# Patient Record
Sex: Male | Born: 1935 | Race: Black or African American | Hispanic: No | Marital: Single | Smoking: Never smoker
Health system: Southern US, Community
[De-identification: ages and names within clinical notes are randomized; demographics above are authoritative.]

## PROBLEM LIST (undated history)

## (undated) DIAGNOSIS — E1165 Type 2 diabetes mellitus with hyperglycemia: Secondary | ICD-10-CM

## (undated) DIAGNOSIS — I10 Essential (primary) hypertension: Secondary | ICD-10-CM

## (undated) DIAGNOSIS — E7800 Pure hypercholesterolemia, unspecified: Secondary | ICD-10-CM

## (undated) DIAGNOSIS — M109 Gout, unspecified: Secondary | ICD-10-CM

## (undated) DIAGNOSIS — E1100 Type 2 diabetes mellitus with hyperosmolarity without nonketotic hyperglycemic-hyperosmolar coma (NKHHC): Secondary | ICD-10-CM

---

## 1998-05-03 ENCOUNTER — Emergency Department (HOSPITAL_COMMUNITY): Admission: EM | Admit: 1998-05-03 | Discharge: 1998-05-03 | Payer: Self-pay | Admitting: Emergency Medicine

## 1999-11-27 ENCOUNTER — Ambulatory Visit (HOSPITAL_COMMUNITY): Admission: RE | Admit: 1999-11-27 | Discharge: 1999-11-27 | Payer: Self-pay | Admitting: *Deleted

## 1999-11-27 ENCOUNTER — Encounter (INDEPENDENT_AMBULATORY_CARE_PROVIDER_SITE_OTHER): Payer: Self-pay | Admitting: *Deleted

## 2002-11-11 ENCOUNTER — Ambulatory Visit (HOSPITAL_COMMUNITY): Admission: RE | Admit: 2002-11-11 | Discharge: 2002-11-11 | Payer: Self-pay | Admitting: *Deleted

## 2002-11-11 ENCOUNTER — Encounter (INDEPENDENT_AMBULATORY_CARE_PROVIDER_SITE_OTHER): Payer: Self-pay | Admitting: *Deleted

## 2003-01-05 ENCOUNTER — Encounter: Admission: RE | Admit: 2003-01-05 | Discharge: 2003-01-05 | Payer: Self-pay | Admitting: Cardiology

## 2003-01-05 ENCOUNTER — Encounter: Payer: Self-pay | Admitting: Cardiology

## 2005-01-09 ENCOUNTER — Encounter: Admission: RE | Admit: 2005-01-09 | Discharge: 2005-01-09 | Payer: Self-pay | Admitting: Urology

## 2005-01-15 ENCOUNTER — Ambulatory Visit (HOSPITAL_BASED_OUTPATIENT_CLINIC_OR_DEPARTMENT_OTHER): Admission: RE | Admit: 2005-01-15 | Discharge: 2005-01-15 | Payer: Self-pay | Admitting: Urology

## 2005-01-15 ENCOUNTER — Ambulatory Visit (HOSPITAL_COMMUNITY): Admission: RE | Admit: 2005-01-15 | Discharge: 2005-01-15 | Payer: Self-pay | Admitting: Urology

## 2006-02-04 ENCOUNTER — Ambulatory Visit (HOSPITAL_BASED_OUTPATIENT_CLINIC_OR_DEPARTMENT_OTHER): Admission: RE | Admit: 2006-02-04 | Discharge: 2006-02-04 | Payer: Self-pay | Admitting: Ophthalmology

## 2006-02-04 ENCOUNTER — Encounter (INDEPENDENT_AMBULATORY_CARE_PROVIDER_SITE_OTHER): Payer: Self-pay | Admitting: Ophthalmology

## 2009-08-10 ENCOUNTER — Encounter: Admission: RE | Admit: 2009-08-10 | Discharge: 2009-08-10 | Payer: Self-pay | Admitting: Cardiology

## 2010-12-15 NOTE — Op Note (Signed)
NAME:  Joseph Robertson, Joseph Robertson                ACCOUNT NO.:  [id number]   MEDICAL RECORD NO.:  [id number]          PATIENT TYPE:  AMB   LOCATION:  DSC                          FACILITY:  [facility]   PHYSICIAN:  Salley Scarlet., M.D.DATE OF BIRTH:  1933/07/24   DATE OF PROCEDURE:  02/05/2006  DATE OF DISCHARGE:                                 OPERATIVE REPORT   PREOPERATIVE DIAGNOSIS:  Cyst upper lid, left eye.   POSTOPERATIVE DIAGNOSIS:  Cyst upper lid, left eye.   OPERATION:  Cyst excision upper lid, left eye.   ANESTHESIA:  Local using Xylocaine 1%.   PROCEDURE:  The upper lid of the left eye was inspected and there was  located along the medial aspect of upper lid between the brow and lid  margin, a cyst having the dimensions of approximately 1.5 cm x 1 The lid was  then prepped and draped in the usual manner.  The lid was infiltrated with  several mL of Xylocaine.  A curvilinear incision was made around the lesion  and the lesion was carefully undermined using sharp and blunt dissection.  The lesion was then excised in toto. The skin was then reapproximated by  using multiple sutures of interrupted 6-0 silk. A Polysporin ophthalmic  ointment and a pressure patch was applied.  The patient tolerated the  procedure well and was discharged in satisfactory condition with  instructions to take Tylenol every 4 hours as needed for pain, to remove the  patch tomorrow, to apply Polysporin opthalmic ointment to the lesion twice a  day and to see me in the office in one week.   DISCHARGE DIAGNOSIS:  Cyst upper lid, left eye.      Salley Scarlet., M.D.  Electronically Signed     TB/MEDQ  D:  02/05/2006  T:  02/05/2006  Job:  [id number]

## 2010-12-15 NOTE — Procedures (Signed)
[place]  Patient:    Joseph Robertson, Joseph Robertson                       MRN: [id number] Proc. Date: 11/27/99 Adm. Date:  [id number] Attending:  Mingo Amber CC:         Osvaldo Shipper. Spruill, M.D.                           Procedure Report  PROCEDURE:  Video colonoscopy.  INDICATIONS FOR PROCEDURE:  A 77 year old male requesting screening colonoscopy.  PREPARATION:  He is n.p.o. since midnight, having taken Phospho-Soda prep and a  clear liquid diet yesterday.  The mucosa is clean throughout.  DEPTH OF INSERTION:  Cecum.  PREPROCEDURE SEDATION:  He received 100 mcg of fentanyl and 10 mg of Versed intravenously.  In addition, he was on 2 L of nasal cannula O2.  DESCRIPTION OF PROCEDURE:  The Olympus video colonoscope was inserted via the rectum and advanced quite easily all the way through the colon to the cecum. Cecal landmarks were identified and photographed.  On withdrawal, the mucosa was carefully evaluated.  Note was made of pandiverticulosis, worse on the left than the right, but this was only moderate in number.  At 35 cm from the rectum, was a 1 cm pedunculated polyp that was removed with electrosnare cautery.  A second diminutive sessile polyp was just adjacent, and also removed.  The specimens were recovered for histologic evaluation.  A retroflexed view of the rectum demonstrated grade 1-2 internal hemorrhoids.  No other lesions were noted.  The patient tolerated the procedure well.  Pulse, blood pressure and oximetry testing were stable throughout.  He was observed in recovery for 45 minutes and discharged to home alert with a benign abdomen.  IMPRESSION: 1. Two small sigmoid polyps. 2. Pandiverticulosis. 3. Grade 1-2 internal hemorrhoids.  RECOMMENDATIONS:  The patient was given a brochure on diverticulosis.  High fiber diet is advised.  He will be alerted in a letter of the polyp histology and probable advised to have repeat  colonoscopy in three years, should the larger polyp prove to be adenomatous, which I suspect.  Return to the office p.r.n. DD:  11/27/99 TD:  11/28/99 Job: [id number]

## 2010-12-15 NOTE — Op Note (Signed)
NAME:  Joseph Robertson, Joseph Robertson                ACCOUNT NO.:  [id number]   MEDICAL RECORD NO.:  [id number]          PATIENT TYPE:  AMB   LOCATION:  [facility]                         FACILITY:  [facility]   PHYSICIAN:  Lindaann Slough, M.D.  DATE OF BIRTH:  January 01, 1933   DATE OF PROCEDURE:  01/15/2005  DATE OF DISCHARGE:                                 OPERATIVE REPORT   PREOPERATIVE DIAGNOSIS:  Left hydrocele.   POSTOPERATIVE DIAGNOSIS:  Left hydrocele.   PROCEDURE DONE:  Left hydrocelectomy.   SURGEON:  Lindaann Slough, M.D.   ANESTHESIA:  General.   INDICATIONS:  The patient is a 77 year old male who had been complaining of  swelling of the scrotum for several months. He was found on physical  examination to have bilateral hydrocele, left greater than right. The  hydrocele on the right side is small. He is scheduled today for  hydrocelectomy.   DESCRIPTION OF PROCEDURE:  Under general anesthesia, the patient was prepped  and draped and placed in the supine position.  A longitudinal incision was  made on the left scrotum. The incision was carried down to the tunica  vaginalis which was then incised.  About 250 cc of fluid were drained out of  the hydrocele sac. The appendix testis was then excised, and then the tunica  vaginalis was imbricated using the Lord's technique with #3-0 chromic.  Hemostasis was secured with electrocautery. The wound was then irrigated  with normal saline.  Then, the scrotum was closed in two  layers with #3-0  chromic.  Then, the incision was infiltrated with 0.25% Marcaine.  The  patient tolerated the procedure well and left the OR in satisfactory  condition to the postanesthesia care unit.       MN/MEDQ  D:  01/15/2005  T:  01/15/2005  Job:  [id number]   cc:   Osvaldo Shipper. Spruill, M.D.  [address] [address]  [place]  [place] [address]  Fax: [phone]

## 2016-11-10 ENCOUNTER — Emergency Department (HOSPITAL_COMMUNITY)
Admission: EM | Admit: 2016-11-10 | Discharge: 2016-11-10 | Disposition: A | Discharge status: Home / Self Care | Payer: Medicare Other | Attending: Emergency Medicine | Admitting: Emergency Medicine

## 2016-11-10 ENCOUNTER — Encounter (HOSPITAL_COMMUNITY): Payer: Self-pay | Admitting: Emergency Medicine

## 2016-11-10 DIAGNOSIS — I10 Essential (primary) hypertension: Secondary | ICD-10-CM | POA: Insufficient documentation

## 2016-11-10 DIAGNOSIS — Z79899 Other long term (current) drug therapy: Secondary | ICD-10-CM | POA: Diagnosis not present

## 2016-11-10 DIAGNOSIS — B029 Zoster without complications: Secondary | ICD-10-CM | POA: Insufficient documentation

## 2016-11-10 DIAGNOSIS — R21 Rash and other nonspecific skin eruption: Secondary | ICD-10-CM | POA: Diagnosis present

## 2016-11-10 HISTORY — DX: Gout, unspecified: M10.9

## 2016-11-10 HISTORY — DX: Pure hypercholesterolemia, unspecified: E78.00

## 2016-11-10 HISTORY — DX: Essential (primary) hypertension: I10

## 2016-11-10 MED ORDER — ACYCLOVIR 400 MG PO TABS: 800.0000 mg | ORAL_TABLET | Freq: Every day | ORAL | 0 refills | Status: DC

## 2016-11-10 MED ORDER — TETRACAINE HCL 0.5 % OP SOLN
1.0000 [drp] | Freq: Once | OPHTHALMIC | Status: AC
  Administered 2016-11-10: 1 [drp] via OPHTHALMIC
  Filled 2016-11-10: qty 2

## 2016-11-10 MED ORDER — FLUORESCEIN SODIUM 0.6 MG OP STRP
1.0000 | ORAL_STRIP | Freq: Once | OPHTHALMIC | Status: AC
  Administered 2016-11-10: 1 via OPHTHALMIC
  Filled 2016-11-10: qty 1

## 2016-11-10 NOTE — Discharge Instructions (Signed)
Begin taking acyclovir 5 times daily for 7 days. Follow up with eye doctor as soon as possible. Follow up with PCP in 1-2 days. Return to ED for worsening symptoms, no improvement, vision changes, discharge from eye, trouble breathing, chest pain.

## 2016-11-10 NOTE — ED Provider Notes (Signed)
[facility] Provider Note   CSN: [id number] Arrival date & time: 11/10/16  1128     History   Chief Complaint Chief Complaint  Patient presents with  . Eye Problem  . Rash    HPI Joseph Robertson is a 83 y.o. male.  Patient presents with one-week history of L eye redness and swelling with associated rash on corner of eye, L side of forehead, L side of scalp that began and has developed and increased since eye swelling began. No changes in vision or discharge from eye. He saw his optometrist who was unsure of the cause of the eye swelling. He reports pain on the L side of his scalp near the area of the lesion. No history of trauma, use of new soaps or lotions or bites. States he received shingles vaccine 2-3 years ago. No prior history of these symptoms in the past. Denies lesions elsewhere, nausea, vomiting, trouble hearing, headache, trouble breathing.      Past Medical History:  Diagnosis Date  . Gout   . High cholesterol   . Hypertension     There are no active problems to display for this patient.   History reviewed. No pertinent surgical history.     Home Medications    Prior to Admission medications   Medication Sig Start Date End Date Taking? Authorizing Provider  allopurinol (ZYLOPRIM) 100 MG tablet Take 100 mg by mouth daily. 08/17/16  Yes Historical Provider, MD  amLODipine-benazepril (LOTREL) 10-20 MG capsule Take 1 capsule by mouth daily. 10/11/16  Yes Historical Provider, MD  metoprolol succinate (TOPROL-XL) 25 MG 24 hr tablet Take 25 mg by mouth every evening. 10/11/16  Yes Historical Provider, MD  simvastatin (ZOCOR) 20 MG tablet Take 20 mg by mouth daily. 10/11/16  Yes Historical Provider, MD  TOBRADEX ophthalmic ointment Place 1 application into the left eye 2 (two) times daily. 11/07/16  Yes Historical Provider, MD  white petrolatum (VASELINE) GEL Apply 1 application topically daily.   Yes Historical Provider, MD  acyclovir (ZOVIRAX) 400 MG  tablet Take 2 tablets (800 mg total) by mouth 5 (five) times daily. 11/10/16   Verble Styron, PA-C    Family History No family history on file.  Social History Social History  Substance Use Topics  . Smoking status: Never Smoker  . Smokeless tobacco: Never Used  . Alcohol use No     Allergies   Patient has no known allergies.   Review of Systems Review of Systems  Constitutional: Negative for appetite change, chills and fever.  HENT: Negative for ear pain, sneezing and sore throat.   Eyes: Positive for redness. Negative for photophobia, pain, discharge, itching and visual disturbance.  Respiratory: Negative for cough, chest tightness, shortness of breath and wheezing.   Cardiovascular: Negative for chest pain and palpitations.  Gastrointestinal: Negative for abdominal pain, blood in stool, constipation, diarrhea, nausea and vomiting.  Genitourinary: Negative for dysuria.  Musculoskeletal: Negative for myalgias.  Skin: Positive for rash.  Neurological: Negative for dizziness, weakness, light-headedness and headaches.     Physical Exam Updated Vital Signs BP (!) 166/92   Pulse 80   Temp 98.5 F (36.9 C) (Oral)   Resp 16   Ht 6' (1.829 m)   Wt 98.4 kg   SpO2 98%   BMI 29.43 kg/m   Physical Exam  Constitutional: He appears well-developed and well-nourished. No distress.  HENT:  Head: Normocephalic and atraumatic.  Nose: Nose normal.  Eyes: EOM are normal. Pupils are equal,  round, and reactive to light. Right eye exhibits no discharge. Left eye exhibits no discharge. Left conjunctiva is injected. No scleral icterus. Right eye exhibits normal extraocular motion. Left eye exhibits normal extraocular motion.  Fluorescein exam revealed no dendritic lesions.  Neck: Normal range of motion. Neck supple.  Cardiovascular: Normal rate, regular rhythm, normal heart sounds and intact distal pulses.  Exam reveals no gallop and no friction rub.   No murmur heard. Pulmonary/Chest:  Effort normal and breath sounds normal. No respiratory distress.  Abdominal: Soft. Bowel sounds are normal. He exhibits no distension. There is no tenderness. There is no guarding.  Musculoskeletal: Normal range of motion. He exhibits no edema.  Neurological: He is alert. He exhibits normal muscle tone. Coordination normal.  Skin: Skin is warm and dry. Rash noted.  See image attached. Vesicular, scabbed over lesions present at corner of L eye, forehead and scalp. No lesions present around ear or on R side of head. No lesions on tip of nose.  Psychiatric: He has a normal mood and affect.  Nursing note and vitals reviewed.      ED Treatments / Results  Labs (all labs ordered are listed, but only abnormal results are displayed) Labs Reviewed - No data to display  EKG  EKG Interpretation None       Radiology No results found.  Procedures Procedures (including critical care time)  Medications Ordered in ED Medications  fluorescein ophthalmic strip 1 strip (1 strip Left Eye Given by Other 11/10/16 1307)  tetracaine (PONTOCAINE) 0.5 % ophthalmic solution 1 drop (1 drop Left Eye Given by Other 11/10/16 1307)     Initial Impression / Assessment and Plan / ED Course  I have reviewed the triage vital signs and the nursing notes.  Pertinent labs & imaging results that were available during my care of the patient were reviewed by me and considered in my medical decision making (see chart for details).     Patient's history and symptoms concerning for shingles vs. MRSA/cellulitis. Patient denies history of prior skin infections, MRSA infections or IVDU. Lesions are consistent with shingles presentation, in that they do not cross midline and are vesicular lesions with associated pain. No ocular involvement noted, due to normal visual acuity and normal fluorescein exam. Will give acyclovir. Patient is out of window for steroid treatment as this will cause more harm than good. Return  precautions given.   Final Clinical Impressions(s) / ED Diagnoses   Final diagnoses:  Herpes zoster without complication    New Prescriptions Discharge Medication List as of 11/10/2016  1:49 PM    START taking these medications   Details  acyclovir (ZOVIRAX) 400 MG tablet Take 2 tablets (800 mg total) by mouth 5 (five) times daily., Starting Sat 11/10/2016, Print         Manika Hast [place], [place] 11/10/16 2337    Nira Conn, MD 11/11/16 [phone]

## 2016-11-10 NOTE — ED Triage Notes (Signed)
Pt. Stated, Im having red and swelling in my left eye and this rash and scabs around my face , started last Tuesday. I thought it was shingles. I went to my eye doctor he didn't know.

## 2017-05-19 ENCOUNTER — Observation Stay (HOSPITAL_COMMUNITY)
Admission: EM | Admit: 2017-05-19 | Discharge: 2017-05-21 | Disposition: A | Discharge status: Home / Self Care | Payer: Medicare Other | Attending: Internal Medicine | Admitting: Internal Medicine

## 2017-05-19 ENCOUNTER — Emergency Department (HOSPITAL_COMMUNITY): Payer: Medicare Other

## 2017-05-19 DIAGNOSIS — R000 Tachycardia, unspecified: Secondary | ICD-10-CM

## 2017-05-19 DIAGNOSIS — Z7982 Long term (current) use of aspirin: Secondary | ICD-10-CM | POA: Insufficient documentation

## 2017-05-19 DIAGNOSIS — I10 Essential (primary) hypertension: Secondary | ICD-10-CM | POA: Diagnosis not present

## 2017-05-19 DIAGNOSIS — M109 Gout, unspecified: Secondary | ICD-10-CM | POA: Diagnosis not present

## 2017-05-19 DIAGNOSIS — R531 Weakness: Secondary | ICD-10-CM | POA: Insufficient documentation

## 2017-05-19 DIAGNOSIS — Z79899 Other long term (current) drug therapy: Secondary | ICD-10-CM | POA: Diagnosis not present

## 2017-05-19 DIAGNOSIS — E1100 Type 2 diabetes mellitus with hyperosmolarity without nonketotic hyperglycemic-hyperosmolar coma (NKHHC): Secondary | ICD-10-CM

## 2017-05-19 DIAGNOSIS — E785 Hyperlipidemia, unspecified: Secondary | ICD-10-CM | POA: Diagnosis present

## 2017-05-19 DIAGNOSIS — R739 Hyperglycemia, unspecified: Secondary | ICD-10-CM | POA: Diagnosis present

## 2017-05-19 DIAGNOSIS — N179 Acute kidney failure, unspecified: Secondary | ICD-10-CM | POA: Insufficient documentation

## 2017-05-19 DIAGNOSIS — E7800 Pure hypercholesterolemia, unspecified: Secondary | ICD-10-CM | POA: Insufficient documentation

## 2017-05-19 DIAGNOSIS — E1165 Type 2 diabetes mellitus with hyperglycemia: Secondary | ICD-10-CM | POA: Diagnosis not present

## 2017-05-19 DIAGNOSIS — E861 Hypovolemia: Secondary | ICD-10-CM

## 2017-05-19 HISTORY — DX: Type 2 diabetes mellitus with hyperosmolarity without nonketotic hyperglycemic-hyperosmolar coma (NKHHC): E11.00

## 2017-05-19 HISTORY — DX: Type 2 diabetes mellitus with hyperglycemia: E11.65

## 2017-05-19 LAB — BASIC METABOLIC PANEL
Anion gap: 6 (ref 5–15)
BUN: 29 mg/dL — AB (ref 6–20)
CO2: 25 mmol/L (ref 22–32)
CREATININE: 1.9 mg/dL — AB (ref 0.61–1.24)
Calcium: 9.3 mg/dL (ref 8.9–10.3)
Chloride: 109 mmol/L (ref 101–111)
GFR, EST AFRICAN AMERICAN: 36 mL/min — AB (ref >60)
GFR, EST NON AFRICAN AMERICAN: 31 mL/min — AB (ref >60)
Glucose, Bld: 332 mg/dL — ABNORMAL HIGH (ref 65–99)
Potassium: 5 mmol/L (ref 3.5–5.1)
SODIUM: 140 mmol/L (ref 135–145)

## 2017-05-19 LAB — I-STAT VENOUS BLOOD GAS, ED
ACID-BASE EXCESS: 6 mmol/L — AB (ref 0.0–2.0)
Bicarbonate: 30.9 mmol/L — ABNORMAL HIGH (ref 20.0–28.0)
O2 Saturation: 84 %
PH VEN: 7.448 — AB (ref 7.250–7.430)
PO2 VEN: 45 mmHg (ref 32.0–45.0)
TCO2: 32 mmol/L (ref 22–32)
pCO2, Ven: 44.6 mmHg (ref 44.0–60.0)

## 2017-05-19 LAB — I-STAT CHEM 8, ED
BUN: 35 mg/dL — AB (ref 6–20)
CALCIUM ION: 1.25 mmol/L (ref 1.15–1.40)
CHLORIDE: 100 mmol/L — AB (ref 101–111)
Creatinine, Ser: 2 mg/dL — ABNORMAL HIGH (ref 0.61–1.24)
Glucose, Bld: >700 mg/dL (ref 65–99)
HEMATOCRIT: 52 % (ref 39.0–52.0)
Hemoglobin: 17.7 g/dL — ABNORMAL HIGH (ref 13.0–17.0)
POTASSIUM: 4.8 mmol/L (ref 3.5–5.1)
SODIUM: 140 mmol/L (ref 135–145)
TCO2: 26 mmol/L (ref 22–32)

## 2017-05-19 LAB — URINALYSIS, ROUTINE W REFLEX MICROSCOPIC
BACTERIA UA: NONE SEEN
BILIRUBIN URINE: NEGATIVE
Glucose, UA: >=500 mg/dL — AB
HGB URINE DIPSTICK: NEGATIVE
Ketones, ur: 5 mg/dL — AB
LEUKOCYTES UA: NEGATIVE
NITRITE: NEGATIVE
PROTEIN: NEGATIVE mg/dL
RBC / HPF: NONE SEEN RBC/hpf (ref 0–5)
SQUAMOUS EPITHELIAL / LPF: NONE SEEN
Specific Gravity, Urine: 1.03 (ref 1.005–1.030)
pH: 5 (ref 5.0–8.0)

## 2017-05-19 LAB — COMPREHENSIVE METABOLIC PANEL
ALBUMIN: 4.4 g/dL (ref 3.5–5.0)
ALT: 14 U/L — AB (ref 17–63)
AST: 16 U/L (ref 15–41)
Alkaline Phosphatase: 114 U/L (ref 38–126)
Anion gap: 10 (ref 5–15)
BILIRUBIN TOTAL: 1.4 mg/dL — AB (ref 0.3–1.2)
BUN: 33 mg/dL — AB (ref 6–20)
CHLORIDE: 99 mmol/L — AB (ref 101–111)
CO2: 26 mmol/L (ref 22–32)
CREATININE: 2.33 mg/dL — AB (ref 0.61–1.24)
Calcium: 10 mg/dL (ref 8.9–10.3)
GFR calc Af Amer: 28 mL/min — ABNORMAL LOW (ref >60)
GFR, EST NON AFRICAN AMERICAN: 24 mL/min — AB (ref >60)
GLUCOSE: 845 mg/dL — AB (ref 65–99)
Potassium: 4.6 mmol/L (ref 3.5–5.1)
Sodium: 135 mmol/L (ref 135–145)
TOTAL PROTEIN: 8.1 g/dL (ref 6.5–8.1)

## 2017-05-19 LAB — PROTIME-INR
INR: 1
Prothrombin Time: 13.1 seconds (ref 11.4–15.2)

## 2017-05-19 LAB — DIFFERENTIAL
BASOS ABS: 0 10*3/uL (ref 0.0–0.1)
Basophils Relative: 0 %
Eosinophils Absolute: 0 10*3/uL (ref 0.0–0.7)
Eosinophils Relative: 0 %
LYMPHS ABS: 0.8 10*3/uL (ref 0.7–4.0)
Lymphocytes Relative: 14 %
MONOS PCT: 5 %
Monocytes Absolute: 0.3 10*3/uL (ref 0.1–1.0)
NEUTROS ABS: 4.4 10*3/uL (ref 1.7–7.7)
NEUTROS PCT: 81 %

## 2017-05-19 LAB — CBC
HEMATOCRIT: 51.1 % (ref 39.0–52.0)
HEMOGLOBIN: 17.1 g/dL — AB (ref 13.0–17.0)
MCH: 29.5 pg (ref 26.0–34.0)
MCHC: 33.5 g/dL (ref 30.0–36.0)
MCV: 88.1 fL (ref 78.0–100.0)
Platelets: 243 10*3/uL (ref 150–400)
RBC: 5.8 MIL/uL (ref 4.22–5.81)
RDW: 13.3 % (ref 11.5–15.5)
WBC: 5.4 10*3/uL (ref 4.0–10.5)

## 2017-05-19 LAB — CBG MONITORING, ED
GLUCOSE-CAPILLARY: 496 mg/dL — AB (ref 65–99)
Glucose-Capillary: >600 mg/dL (ref 65–99)
Glucose-Capillary: 423 mg/dL — ABNORMAL HIGH (ref 65–99)
Glucose-Capillary: 319 mg/dL — ABNORMAL HIGH (ref 65–99)
Glucose-Capillary: 128 mg/dL — ABNORMAL HIGH (ref 65–99)
Glucose-Capillary: 143 mg/dL — ABNORMAL HIGH (ref 65–99)

## 2017-05-19 LAB — HEMOGLOBIN A1C
Hgb A1c MFr Bld: 14.8 % — ABNORMAL HIGH (ref 4.8–5.6)
Mean Plasma Glucose: 378.06 mg/dL

## 2017-05-19 LAB — I-STAT TROPONIN, ED: Troponin i, poc: 0 ng/mL (ref 0.00–0.08)

## 2017-05-19 LAB — APTT: aPTT: 27 seconds (ref 24–36)

## 2017-05-19 LAB — MAGNESIUM: Magnesium: 2.5 mg/dL — ABNORMAL HIGH (ref 1.7–2.4)

## 2017-05-19 LAB — TSH: TSH: 1.304 u[IU]/mL (ref 0.350–4.500)

## 2017-05-19 MED ORDER — POTASSIUM CHLORIDE 10 MEQ/100ML IV SOLN
10.0000 meq | INTRAVENOUS | Status: AC
  Administered 2017-05-19 (×2): 10 meq via INTRAVENOUS
  Filled 2017-05-19 (×2): qty 100

## 2017-05-19 MED ORDER — ASPIRIN EC 81 MG PO TBEC
81.0000 mg | DELAYED_RELEASE_TABLET | Freq: Every day | ORAL | Status: DC
  Administered 2017-05-20 – 2017-05-21 (×2): 81 mg via ORAL
  Filled 2017-05-19 (×2): qty 1

## 2017-05-19 MED ORDER — INSULIN GLARGINE 100 UNIT/ML ~~LOC~~ SOLN
20.0000 [IU] | Freq: Once | SUBCUTANEOUS | Status: AC
  Administered 2017-05-20: 20 [IU] via SUBCUTANEOUS
  Filled 2017-05-19 (×2): qty 0.2

## 2017-05-19 MED ORDER — SODIUM CHLORIDE 0.9 % IV BOLUS (SEPSIS)
1000.0000 mL | Freq: Once | INTRAVENOUS | Status: AC
  Administered 2017-05-19: 1000 mL via INTRAVENOUS

## 2017-05-19 MED ORDER — SODIUM CHLORIDE 0.9 % IV BOLUS (SEPSIS): 1000.0000 mL | Freq: Once | INTRAVENOUS | Status: DC

## 2017-05-19 MED ORDER — ACETAMINOPHEN 325 MG PO TABS: 650.0000 mg | ORAL_TABLET | Freq: Four times a day (QID) | ORAL | Status: DC | PRN

## 2017-05-19 MED ORDER — INSULIN GLARGINE 100 UNIT/ML ~~LOC~~ SOLN: 16.0000 [IU] | Freq: Once | SUBCUTANEOUS | Status: DC

## 2017-05-19 MED ORDER — SODIUM CHLORIDE 0.9 % IV SOLN
INTRAVENOUS | Status: DC
  Administered 2017-05-19: 20:00:00 via INTRAVENOUS

## 2017-05-19 MED ORDER — AMLODIPINE BESYLATE 5 MG PO TABS
10.0000 mg | ORAL_TABLET | Freq: Every day | ORAL | Status: DC
  Administered 2017-05-20 – 2017-05-21 (×2): 10 mg via ORAL
  Filled 2017-05-19: qty 2
  Filled 2017-05-19: qty 1

## 2017-05-19 MED ORDER — SODIUM CHLORIDE 0.9 % IV BOLUS (SEPSIS)
2000.0000 mL | Freq: Once | INTRAVENOUS | Status: AC
  Administered 2017-05-19: 2000 mL via INTRAVENOUS

## 2017-05-19 MED ORDER — DEXTROSE-NACL 5-0.45 % IV SOLN
INTRAVENOUS | Status: DC
  Administered 2017-05-19: 22:00:00 via INTRAVENOUS

## 2017-05-19 MED ORDER — DEXTROSE 50 % IV SOLN: 1.0000 | INTRAVENOUS | Status: DC | PRN

## 2017-05-19 MED ORDER — ACETAMINOPHEN 650 MG RE SUPP: 650.0000 mg | Freq: Four times a day (QID) | RECTAL | Status: DC | PRN

## 2017-05-19 MED ORDER — ALLOPURINOL 100 MG PO TABS
100.0000 mg | ORAL_TABLET | Freq: Every day | ORAL | Status: DC
  Administered 2017-05-20 – 2017-05-21 (×2): 100 mg via ORAL
  Filled 2017-05-19 (×2): qty 1

## 2017-05-19 MED ORDER — SODIUM CHLORIDE 0.9 % IV BOLUS (SEPSIS): 2000.0000 mL | Freq: Once | INTRAVENOUS | Status: DC

## 2017-05-19 MED ORDER — INSULIN REGULAR HUMAN 100 UNIT/ML IJ SOLN
INTRAMUSCULAR | Status: DC
  Administered 2017-05-19: 5.4 [IU]/h via INTRAVENOUS
  Filled 2017-05-19: qty 1

## 2017-05-19 MED ORDER — SIMVASTATIN 20 MG PO TABS
20.0000 mg | ORAL_TABLET | Freq: Every day | ORAL | Status: DC
  Administered 2017-05-20 – 2017-05-21 (×2): 20 mg via ORAL
  Filled 2017-05-19 (×2): qty 1

## 2017-05-19 MED ORDER — METOPROLOL SUCCINATE ER 25 MG PO TB24
25.0000 mg | ORAL_TABLET | Freq: Every evening | ORAL | Status: DC
  Administered 2017-05-19 – 2017-05-20 (×2): 25 mg via ORAL
  Filled 2017-05-19 (×2): qty 1

## 2017-05-19 MED ORDER — HEPARIN SODIUM (PORCINE) 5000 UNIT/ML IJ SOLN
5000.0000 [IU] | Freq: Three times a day (TID) | INTRAMUSCULAR | Status: DC
  Administered 2017-05-20 – 2017-05-21 (×4): 5000 [IU] via SUBCUTANEOUS
  Filled 2017-05-19 (×4): qty 1

## 2017-05-19 NOTE — ED Notes (Signed)
Admitting md's at bedside. Pt stable. VSS. Labs to be drawn.

## 2017-05-19 NOTE — ED Triage Notes (Addendum)
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 States 3 days ago she noted he was having trouble walking, shuffling gait. Pt pt has left sided facial droop. Alert and oriented to person place and time. No sensory deficits, no drift to arms or legs. No dysarthria/slurred speech but wife states his voice has sounded different for 3 days. Pt wife states she finally made him come today when he almost fell down the stairs. Pt also states a few weeks ago he has noticed frequent urination.

## 2017-05-19 NOTE — ED Notes (Signed)
Dr.tegeler informed of pts Istat Chem8 results. ED-Lab.

## 2017-05-19 NOTE — H&P (Signed)
Date: 05/19/2017               Patient Name:  Joseph Robertson MRN: [id number]  DOB: 1932/10/12 Age / Sex: 83 y.o., male   PCP: Patient, No Pcp Per         Medical Service: Internal Medicine Teaching Service         Attending Physician: Dr. Gust Rung, DO    First Contact: Dr. Thornell Mule Pager: [phone]  Second Contact: Dr. Nyra Market Pager: [phone]       After Hours (After 5p/  First Contact Pager: [phone]  weekends / holidays): Second Contact Pager: [phone]   Chief Complaint: several weeks polyuria, polydipsia, and weakness  History of Present Illness: Joseph Robertson is an 83yo male with PMH significant for HTN, hypercholesterolemia, and gout who presents with several weeks of polyuria and polydipsia. Wife and daughter are at bedside, who helped provide the history. This morning, at church, he felt weak, had trouble walking, and just felt off. For the last couple days, his wife also noted that he has been "not been right" with a change in the tone of his voice, gait change, and not thinking normally. She says the tone of his voice is still not quite right now. Denies focal numbness or weakness of extremities. Patient endorses polyuria and polydipsia for several weeks. Wife also reports that she has noticed he has lost some weight recently, with his pants being looser than they were before. Denies HA, N/V, or blurry vision. Denies fever/chills, chest pain, shortness of breath, cough, congestion, leg swelling, dysuria. Denies smoking or alcohol use.  Denies history of stroke or of diabetes. On aspirin 81mg , not on other blood thinners. No recent medication changes. Wife reports that he does drink a lot of juice and some bread, but does not eat many sweets. His PCP is Dr. Shirline Frees. He had a physical with his PCP and was told that he has "borderline diabetes", but is not sure whether he had an A1c done.  ED course - BP 113/74, HR 104, temp 97.8, RR 18, O2 95% on RA.  - Glucose  845. Na 135, K 4.6, bicarb 26, Cr 2.33 (no baseline available), no anion gap. Venous pH 7.45. WBC 5.4, Hgb 17.1. UA not infected, 5 ketones. Troponin negative and normal INR. Normal TSH. - EKG sinus tachycardia. CXR without active disease. CT head with chronic microvascular ischemia but no acute abnormality. - S/p 3L NS + 121mL/hr. IV K . Insulin drip started.   Meds:  Current Meds  Medication Sig  . allopurinol (ZYLOPRIM) 100 MG tablet Take 100 mg by mouth daily.  Marland Kitchen amLODipine-benazepril (LOTREL) 10-20 MG capsule Take 1 capsule by mouth daily.  Marland Kitchen aspirin EC 81 MG tablet Take 81 mg by mouth daily.  . metoprolol succinate (TOPROL-XL) 25 MG 24 hr tablet Take 25 mg by mouth every evening.  . simvastatin (ZOCOR) 20 MG tablet Take 20 mg by mouth daily.   Allergies: Allergies as of 05/19/2017  . (No Known Allergies)   Past Medical History:  Diagnosis Date  . Gout   . High cholesterol   . Hypertension    Family History:  No family history on file.  Social History:  Social History   Social History  . Marital status: Single    Spouse name: N/A  . Number of children: N/A  . Years of education: N/A   Social History Main Topics  . Smoking status: Never Smoker  .  Smokeless tobacco: Never Used  . Alcohol use No  . Drug use: No  . Sexual activity: Not on file   Other Topics Concern  . Not on file   Social History Narrative  . No narrative on file   Review of Systems: Constitutional: Negative for diaphoresis, fever. Positive for fatigue and weight loss. HEENT: Negative for blurred vision, hearing loss, sinus pain, congestion, and sore throat. Respiratory: Negative for cough, shortness of breath and wheezing. Cardiovascular: Negative for chest pain, orthopnea, PND, and leg swelling. Gastrointestinal: Negative for abdominal pain, blood in stool, constipation, diarrhea, heartburn, nausea and vomiting. Genitourinary: Negative for dysuria and hematuria. Positive for polyuria and  polydipsia. Musculoskeletal: Negative for joint pain and myalgias. Neurological: Negative for dizziness, focal weakness, and headaches. Positive for generalized weakness.  Physical Exam: Blood pressure 125/76, pulse 93, temperature 97.8 F (36.6 C), temperature source Oral, resp. rate 17, SpO2 93 %.  GEN: Well-appearing, well-nourished pleasant elderly male in NAD. Alert and oriented x4. HENT: [place]/AT. Dry mucous membranes. No visible lesions. EYES: PERRL. Sclera non-icteric. Conjunctiva clear. RESP: Clear to auscultation bilaterally. No wheezes, rales, or rhonchi. No increased work of breathing. CV: Normal rate and regular rhythm. No murmurs, gallops, or rubs. No carotid bruits. No LE edema. ABD: Soft. Non-tender. Non-distended. Normoactive bowel sounds. EXT: No edema. Warm. 2+ DP pulses. SKIN: Capillary refill <2 sec. NEURO: Cranial nerves II-XII intact. 5/5 strength in BUE and BLE. No apparent audiovisual hallucinations. Speech fluent and appropriate. PSYCH: Patient is calm and pleasant. Appropriate affect. Well-groomed; speech is appropriate and on-subject.  Labs CBC Latest Ref Rng & Units 05/19/2017 05/19/2017  WBC 4.0 - 10.5 K/uL - 5.4  Hemoglobin 13.0 - 17.0 g/dL 17.7(H) 17.1(H)  Hematocrit 39.0 - 52.0 % 52.0 51.1  Platelets 150 - 400 K/uL - 243   CMP Latest Ref Rng & Units 05/19/2017 05/19/2017 05/19/2017  Glucose 65 - 99 mg/dL 324(M332(H) >010(UV>700(HH) 253(GU845(HH)  BUN 6 - 20 mg/dL 44(I29(H) 34(V35(H) 42(V33(H)  Creatinine 0.61 - 1.24 mg/dL 9.56(L1.90(H) 8.75(I2.00(H) 4.33(I2.33(H)  Sodium 135 - 145 mmol/L 140 140 135  Potassium 3.5 - 5.1 mmol/L 5.0 4.8 4.6  Chloride 101 - 111 mmol/L 109 100(L) 99(L)  CO2 22 - 32 mmol/L 25 - 26  Calcium 8.9 - 10.3 mg/dL 9.3 - 95.110.0  Total Protein 6.5 - 8.1 g/dL - - 8.1  Total Bilirubin 0.3 - 1.2 mg/dL - - 1.4(H)  Alkaline Phos 38 - 126 U/L - - 114  AST 15 - 41 U/L - - 16  ALT 17 - 63 U/L - - 14(L)   Troponin neg INR 1.0 UA with >500 glucose, 5 ketones Venous pH 7.448 TSH  1.304 Mg 2.5  EKG: sinus tachycardia  CXR: no active disease  CT head Atrophy with chronic microvascular ischemia.  No acute abnormality.Atrophy with chronic microvascular ischemia.  No acute abnormality.  Assessment & Plan by Problem: Active Problems:   Hyperglycemia  Joseph Robertson is an 83yo male with PMH significant for HTN, hypercholesterolemia, and gout who presents with polyuria, polydipsia, fatigue, weight loss, and speech and gait change, found to have BG 845 on admission. No history of diabetes or stroke prior to this.  HHS Not acidotic. BG 845 on admission. K 4.8. No history of diabetes although he was told he has "borderline diabetes" at his last physical. Mentating well. Received 3L NS in the ER and insulin drip with K started. BG improved to 319 now. No clear etiology of infection or other acute trigger for  this episode, but it may be a gradual result of untreated diabetes. Patient did have some signs concerning for CVA (dysarthria, gait change), however CT head was negative, and his mentation and speech appear to be improved. Can consider further evaluation if his mental state declines. - IVF NS 138mL/hr --- Change to D5 1/2NS when BG <250, - Cont IV insulin drip until BG 250-300 - IV potassium - HbA1c - CBG q1h - NPO - Neuro checks q2h - CBC, CMP in AM  AKI Cr 2.33. Likely secondary to hypovolemia. - IVF - CMP in AM  HTN Home regimen includes metoprolol 25mg  daily and amlodipine-benazepril 10-20mg  daily - Cont home metoprolol - Hold ACE-I 2/2 AKI  Hypercholesterolemia - Continue home simvastatin 20mg  daily  Gout - Continue home allopurinol 100mg  daily  Diet: NPO VTE PPx: [facility] Dispo: Admit patient to Inpatient with expected length of stay greater than 2 midnights.  Signed: Scherrie Gerlach, MD 05/19/2017, 9:03 PM

## 2017-05-19 NOTE — ED Notes (Signed)
Report delayed due to emergent pt in another room. Family aware and understanding. VSS.

## 2017-05-19 NOTE — ED Provider Notes (Signed)
[facility]   CSN: [id number] Arrival date & time: 05/19/17  1456     History   Chief Complaint Chief Complaint  Patient presents with  . Cerebrovascular Accident    HPI Joseph Robertson is a 83 y.o. male.  The history is provided by the patient, the spouse and medical records. No language interpreter was used.  Neurologic Problem  This is a new problem. The current episode started more than 2 days ago. The problem occurs constantly. The problem has not changed since onset.Pertinent negatives include no chest pain, no abdominal pain, no headaches and no shortness of breath. Nothing aggravates the symptoms. Nothing relieves the symptoms. He has tried nothing for the symptoms. The treatment provided no relief.  Hyperglycemia  Blood sugar level PTA:  >700 Severity:  Severe Onset quality:  Gradual Timing:  Constant Progression:  Unable to specify Chronicity:  New Diabetes status:  Non-diabetic Context: not recent illness   Relieved by:  Nothing Ineffective treatments:  None tried Associated symptoms: dehydration, fatigue, increased thirst, malaise and polyuria   Associated symptoms: no abdominal pain, no altered mental status, no blurred vision, no chest pain, no confusion, no diaphoresis, no dizziness, no dysuria, no fever, no nausea, no shortness of breath, no syncope, no vomiting and no weakness   Risk factors: no hx of DKA     Past Medical History:  Diagnosis Date  . Gout   . High cholesterol   . Hypertension     There are no active problems to display for this patient.   No past surgical history on file.     Home Medications    Prior to Admission medications   Medication Sig Start Date End Date Taking? Authorizing Provider  acyclovir (ZOVIRAX) 400 MG tablet Take 2 tablets (800 mg total) by mouth 5 (five) times daily. 11/10/16   Khatri, Hina, PA-C  allopurinol (ZYLOPRIM) 100 MG tablet Take 100 mg by mouth  daily. 08/17/16   [provider]  amLODipine-benazepril (LOTREL) 10-20 MG capsule Take 1 capsule by mouth daily. 10/11/16   [provider]  metoprolol succinate (TOPROL-XL) 25 MG 24 hr tablet Take 25 mg by mouth every evening. 10/11/16   [provider]  simvastatin (ZOCOR) 20 MG tablet Take 20 mg by mouth daily. 10/11/16   [provider]  TOBRADEX ophthalmic ointment Place 1 application into the left eye 2 (two) times daily. 11/07/16   [provider]  white petrolatum (VASELINE) GEL Apply 1 application topically daily.    [provider]    Family History No family history on file.  Social History Social History  Substance Use Topics  . Smoking status: Never Smoker  . Smokeless tobacco: Never Used  . Alcohol use No     Allergies   Patient has no known allergies.   Review of Systems Review of Systems  Constitutional: Positive for fatigue. Negative for appetite change, chills, diaphoresis and fever.  HENT: Negative for rhinorrhea.   Eyes: Negative for blurred vision and visual disturbance.  Respiratory: Negative for cough, chest tightness, shortness of breath and wheezing.   Cardiovascular: Negative for chest pain and syncope.  Gastrointestinal: Negative for abdominal pain, constipation, diarrhea, nausea and vomiting.  Endocrine: Positive for polydipsia and polyuria.  Genitourinary: Positive for frequency. Negative for dysuria and flank pain.  Musculoskeletal: Negative for back pain, neck pain and neck stiffness.  Skin: Negative for rash and wound.  Neurological: Positive for speech difficulty and  light-headedness. Negative for dizziness, facial asymmetry, weakness and headaches.  Psychiatric/Behavioral: Negative for agitation and confusion.  All other systems reviewed and are negative.    Physical Exam Updated Vital Signs BP 132/84   Pulse 95   Temp 97.8 F (36.6 C) (Oral)   Resp 18   SpO2 98%   Physical Exam    Constitutional: He is oriented to person, place, and time. He appears well-developed and well-nourished. No distress.  HENT:  Head: Normocephalic.  Right Ear: External ear normal.  Left Ear: External ear normal.  Nose: Nose normal.  Mouth/Throat: Oropharynx is clear and moist. No oropharyngeal exudate.  Mucous membranes are extremely dry.  Eyes: Pupils are equal, round, and reactive to light. Conjunctivae and EOM are normal. No scleral icterus.  Neck: Normal range of motion.  Cardiovascular: Normal heart sounds and intact distal pulses.  Tachycardia present.   No murmur heard. Pulmonary/Chest: Effort normal and breath sounds normal. No stridor. No respiratory distress. He has no wheezes. He exhibits no tenderness.  Abdominal: Soft. There is no tenderness.  Musculoskeletal: He exhibits no edema or tenderness.  Neurological: He is alert and oriented to person, place, and time. He is not disoriented. He displays no tremor. No cranial nerve deficit or sensory deficit. He exhibits normal muscle tone. Coordination normal. GCS eye subscore is 4. GCS verbal subscore is 5. GCS motor subscore is 6.  Patient reports coordination problem with gait.  Wife reports some slurred speech intermittently.  Skin: Capillary refill takes less than 2 seconds. He is not diaphoretic. No erythema. No pallor.  Psychiatric: He has a normal mood and affect.  Nursing note and vitals reviewed.    ED Treatments / Results  Labs (all labs ordered are listed, but only abnormal results are displayed) Labs Reviewed  CBC - Abnormal; Notable for the following:       Result Value   Hemoglobin 17.1 (*)    All other components within normal limits  COMPREHENSIVE METABOLIC PANEL - Abnormal; Notable for the following:    Chloride 99 (*)    Glucose, Bld 845 (*)    BUN 33 (*)    Creatinine, Ser 2.33 (*)    ALT 14 (*)    Total Bilirubin 1.4 (*)    GFR calc non Af Amer 24 (*)    GFR calc Af Amer 28 (*)    All other  components within normal limits  URINALYSIS, ROUTINE W REFLEX MICROSCOPIC - Abnormal; Notable for the following:    Color, Urine STRAW (*)    Glucose, UA >=500 (*)    Ketones, ur 5 (*)    All other components within normal limits  BASIC METABOLIC PANEL - Abnormal; Notable for the following:    Glucose, Bld 332 (*)    BUN 29 (*)    Creatinine, Ser 1.90 (*)    GFR calc non Af Amer 31 (*)    GFR calc Af Amer 36 (*)    All other components within normal limits  HEMOGLOBIN A1C - Abnormal; Notable for the following:    Hgb A1c MFr Bld 14.8 (*)    All other components within normal limits  MAGNESIUM - Abnormal; Notable for the following:    Magnesium 2.5 (*)    All other components within normal limits  BASIC METABOLIC PANEL - Abnormal; Notable for the following:    Chloride 115 (*)    CO2 17 (*)    Glucose, Bld 204 (*)    BUN 27 (*)  Creatinine, Ser 1.72 (*)    Calcium 8.6 (*)    GFR calc non Af Amer 35 (*)    GFR calc Af Amer 41 (*)    All other components within normal limits  GLUCOSE, CAPILLARY - Abnormal; Notable for the following:    Glucose-Capillary 190 (*)    All other components within normal limits  GLUCOSE, CAPILLARY - Abnormal; Notable for the following:    Glucose-Capillary 217 (*)    All other components within normal limits  GLUCOSE, CAPILLARY - Abnormal; Notable for the following:    Glucose-Capillary 166 (*)    All other components within normal limits  CBG MONITORING, ED - Abnormal; Notable for the following:    Glucose-Capillary >600 (*)    All other components within normal limits  I-STAT CHEM 8, ED - Abnormal; Notable for the following:    Chloride 100 (*)    BUN 35 (*)    Creatinine, Ser 2.00 (*)    Glucose, Bld >700 (*)    Hemoglobin 17.7 (*)    All other components within normal limits  I-STAT VENOUS BLOOD GAS, ED - Abnormal; Notable for the following:    pH, Ven 7.448 (*)    Bicarbonate 30.9 (*)    Acid-Base Excess 6.0 (*)    All other  components within normal limits  CBG MONITORING, ED - Abnormal; Notable for the following:    Glucose-Capillary >600 (*)    All other components within normal limits  CBG MONITORING, ED - Abnormal; Notable for the following:    Glucose-Capillary 496 (*)    All other components within normal limits  CBG MONITORING, ED - Abnormal; Notable for the following:    Glucose-Capillary 423 (*)    All other components within normal limits  CBG MONITORING, ED - Abnormal; Notable for the following:    Glucose-Capillary 319 (*)    All other components within normal limits  CBG MONITORING, ED - Abnormal; Notable for the following:    Glucose-Capillary 128 (*)    All other components within normal limits  CBG MONITORING, ED - Abnormal; Notable for the following:    Glucose-Capillary 143 (*)    All other components within normal limits  URINE CULTURE  MRSA PCR SCREENING  PROTIME-INR  APTT  DIFFERENTIAL  TSH  COMPREHENSIVE METABOLIC PANEL  CBC  I-STAT TROPONIN, ED    EKG  EKG Interpretation  Date/Time:  Sunday May 19 2017 14:57:02 EDT Ventricular Rate:  106 PR Interval:  142 QRS Duration: 68 QT Interval:  320 QTC Calculation: 425 R Axis:   72 Text Interpretation:  Sinus tachycardia Septal infarct , age undetermined Abnormal ECG When compared to prior, no signifnicant changes seen.  No STEMI Confirmed by Theda Belfast ([id number]) on 05/19/2017 3:50:21 PM       Radiology Ct Head Wo Contrast  Result Date: 05/19/2017 CLINICAL DATA:  Neuro deficit subacute EXAM: CT HEAD WITHOUT CONTRAST TECHNIQUE: Contiguous axial images were obtained from the base of the skull through the vertex without intravenous contrast. COMPARISON:  None. FINDINGS: Brain: Generalized atrophy. Negative for hydrocephalus. Mild patchy hypodensity in the cerebral white matter bilaterally appears chronic. No acute infarct, hemorrhage, mass lesion. Vascular: Negative for hyperdense vessel Skull: Negative Sinuses/Orbits:  Mild mucosal edema in the paranasal sinuses. Normal orbit. Other: None IMPRESSION: Atrophy with chronic microvascular ischemia.  No acute abnormality. Electronically Signed   By: Marlan Palau M.D.   On: 05/19/2017 16:59   Dg Chest Portable 1 View  Result Date:  05/19/2017 CLINICAL DATA:  Hyperglycemia EXAM: PORTABLE CHEST 1 VIEW COMPARISON:  01/09/2005 FINDINGS: The heart size and mediastinal contours are within normal limits. Both lungs are clear. The visualized skeletal structures are unremarkable. IMPRESSION: No active disease. Electronically Signed   By: Marlan Palau M.D.   On: 05/19/2017 18:43    Procedures Procedures (including critical care time)  CRITICAL CARE Performed by: Canary Brim Narmeen Kerper Total critical care time:35 minutes Critical care time was exclusive of separately billable procedures and treating other patients. Critical care was necessary to treat or prevent imminent or life-threatening deterioration. Critical care was time spent personally by me on the following activities: development of treatment plan with patient and/or surrogate as well as nursing, discussions with consultants, evaluation of patient's response to treatment, examination of patient, obtaining history from patient or surrogate, ordering and performing treatments and interventions, ordering and review of laboratory studies, ordering and review of radiographic studies, pulse oximetry and re-evaluation of patient's condition.   Medications Ordered in ED Medications  insulin regular (NOVOLIN R,HUMULIN R) 100 Units in sodium chloride 0.9 % 100 mL (1 Units/mL) infusion (4.7 Units/hr Intravenous Rate/Dose Change 05/20/17 0217)  0.9 %  sodium chloride infusion ( Intravenous Stopped 05/19/17 2146)  aspirin EC tablet 81 mg (not administered)  allopurinol (ZYLOPRIM) tablet 100 mg (not administered)  metoprolol succinate (TOPROL-XL) 24 hr tablet 25 mg (25 mg Oral Given 05/19/17 2153)  simvastatin (ZOCOR)  tablet 20 mg (not administered)  heparin injection 5,000 Units (5,000 Units Subcutaneous Not Given 05/19/17 2200)  acetaminophen (TYLENOL) tablet 650 mg (not administered)    Or  acetaminophen (TYLENOL) suppository 650 mg (not administered)  dextrose 5 %-0.45 % sodium chloride infusion ( Intravenous New Bag/Given 05/19/17 2152)  amLODipine (NORVASC) tablet 10 mg (not administered)  dextrose 50 % solution 50 mL (not administered)  living well with diabetes book MISC (not administered)  potassium chloride 10 mEq in 100 mL IVPB (0 mEq Intravenous Stopped 05/19/17 1845)  sodium chloride 0.9 % bolus 1,000 mL (0 mLs Intravenous Stopped 05/19/17 1739)  sodium chloride 0.9 % bolus 2,000 mL (2,000 mLs Intravenous New Bag/Given 05/19/17 1932)  insulin glargine (LANTUS) injection 20 Units (20 Units Subcutaneous Given 05/20/17 0200)  sodium chloride 0.9 % bolus 1,000 mL (1,000 mLs Intravenous New Bag/Given 05/19/17 2245)     Initial Impression / Assessment and Plan / ED Course  I have reviewed the triage vital signs and the nursing notes.  Pertinent labs & imaging results that were available during my care of the patient were reviewed by me and considered in my medical decision making (see chart for details).     Joseph Robertson is a 83 y.o. male with a past medical history significant for hypertension, hypercholesterolemia, and gout who presents with his wife for multiple complaints including generalized weakness, fatigue, possible intermittent slurred speech, polyuria, polydipsia, urinary frequency, and coordination problems.  Patient's wife reports that over the last several days, patient has "not been right".  She reports that she has noticed he was having some difficulty with slurred speech.  She says that he is still not speaking completely normal at this time however I did not encounter any dysarthria or slurred speech on my initial evaluation.  She reports that patient has been very thirsty for  the last few weeks and has been increasing his urination.  He has been feeling very tired and sleepy.  Patient says that he has had no fevers or chills, any burning when he pees, any  cough, congestion, chest pain, or shortness of breath.  He denies any constipation or diarrhea.  He denies any traumatic injuries.  He denies any focal numbness, tingling, or weakness of extremities.  He reports that he was feeling unsteady when he tried to walk down some stairs after church today.  He denies any headaches, vision changes, nausea, or vomiting.  He denies any history of strokes and denies any history of diabetes.  He has no recent medication changes or other complaints.  On my exam, patient has no focal neurologic deficits.  He was not ambulated due to the fatigue at this time.  Patient had normal extraocular movements.  No facial droop appreciated.  Clear speech.  Very dry mouth on exam.  Normal sensation in all extremities.  Normal finger-nose-finger bilaterally.  Normal strength in upper and lower extremities bilaterally.  Patient was alert.  Lungs clear and chest nontender.  Abdomen nontender.  Patient had screening blood work in triage revealing a glucose greater than 700.  Patient's i-STAT also revealed evidence of an elevated anion gap concerning for DKA.      Given the report of intermittent speech abnormalities and a coordination problem, patient will have workup to look for stroke as well as workup for possible DKA or hyperglycemia causing his symptoms.  Patient's i-STAT potassium was 4.8.  Patient will be given some potassium and insulin drip will be initiated.  Patient will be given fluids.  Patient also workup for occult infection that may have precipitated symptoms.  CT of the head showed chronic microvascular ischemia but no acute abnormality seen.  EKG revealed sinus tachycardia with no STEMI.  Troponin negative.  INR normal.  CBC shows no leukocytosis and elevated hemoglobin likely due to  hemoconcentration and dehydration.  Patient's conference of metabolic panel revealed elevated creatinine of 2.33.  Suspect AK I although there is no baseline for comparison.  Glucose is 845.  Anion gap returned at 10.   Suspect HHK instead of DKA as patient is not acidotic.  Urinalysis showed no infection but there were ketones.  Hospitalist team will be called for admission given the likely new diagnosis of diabetes and severe hyperglycemia causing his symptoms.    Final Clinical Impressions(s) / ED Diagnoses   Final diagnoses:  Hyperglycemia     Clinical Impression: 1. Hyperglycemia     Disposition: Admit to internal medicine service   Riddik Senna, Canary Brim, MD 05/20/17 [phone]

## 2017-05-19 NOTE — ED Notes (Addendum)
Critical told to Tegler MD glucose 845

## 2017-05-19 NOTE — ED Notes (Signed)
Attempted to call report. They will have to call me back. Charge aware.

## 2017-05-20 ENCOUNTER — Encounter (HOSPITAL_COMMUNITY): Payer: Self-pay | Admitting: *Deleted

## 2017-05-20 DIAGNOSIS — R739 Hyperglycemia, unspecified: Secondary | ICD-10-CM | POA: Diagnosis present

## 2017-05-20 DIAGNOSIS — E785 Hyperlipidemia, unspecified: Secondary | ICD-10-CM | POA: Diagnosis present

## 2017-05-20 DIAGNOSIS — N179 Acute kidney failure, unspecified: Secondary | ICD-10-CM | POA: Diagnosis present

## 2017-05-20 DIAGNOSIS — I10 Essential (primary) hypertension: Secondary | ICD-10-CM | POA: Diagnosis present

## 2017-05-20 DIAGNOSIS — E1100 Type 2 diabetes mellitus with hyperosmolarity without nonketotic hyperglycemic-hyperosmolar coma (NKHHC): Secondary | ICD-10-CM | POA: Diagnosis not present

## 2017-05-20 LAB — GLUCOSE, CAPILLARY
GLUCOSE-CAPILLARY: 149 mg/dL — AB (ref 65–99)
GLUCOSE-CAPILLARY: 113 mg/dL — AB (ref 65–99)
GLUCOSE-CAPILLARY: 153 mg/dL — AB (ref 65–99)
Glucose-Capillary: 139 mg/dL — ABNORMAL HIGH (ref 65–99)
Glucose-Capillary: 166 mg/dL — ABNORMAL HIGH (ref 65–99)
Glucose-Capillary: 190 mg/dL — ABNORMAL HIGH (ref 65–99)
Glucose-Capillary: 217 mg/dL — ABNORMAL HIGH (ref 65–99)
Glucose-Capillary: 224 mg/dL — ABNORMAL HIGH (ref 65–99)
Glucose-Capillary: 307 mg/dL — ABNORMAL HIGH (ref 65–99)

## 2017-05-20 LAB — COMPREHENSIVE METABOLIC PANEL
ALK PHOS: 81 U/L (ref 38–126)
ALT: 13 U/L — AB (ref 17–63)
ANION GAP: 10 (ref 5–15)
AST: 29 U/L (ref 15–41)
Albumin: 3.4 g/dL — ABNORMAL LOW (ref 3.5–5.0)
BUN: 23 mg/dL — ABNORMAL HIGH (ref 6–20)
CALCIUM: 9 mg/dL (ref 8.9–10.3)
CO2: 20 mmol/L — ABNORMAL LOW (ref 22–32)
Chloride: 112 mmol/L — ABNORMAL HIGH (ref 101–111)
Creatinine, Ser: 1.64 mg/dL — ABNORMAL HIGH (ref 0.61–1.24)
GFR calc Af Amer: 43 mL/min — ABNORMAL LOW (ref >60)
GFR calc non Af Amer: 37 mL/min — ABNORMAL LOW (ref >60)
Glucose, Bld: 197 mg/dL — ABNORMAL HIGH (ref 65–99)
Potassium: 4.7 mmol/L (ref 3.5–5.1)
SODIUM: 142 mmol/L (ref 135–145)
Total Bilirubin: 1.9 mg/dL — ABNORMAL HIGH (ref 0.3–1.2)
Total Protein: 5.9 g/dL — ABNORMAL LOW (ref 6.5–8.1)

## 2017-05-20 LAB — BASIC METABOLIC PANEL
Anion gap: 7 (ref 5–15)
BUN: 27 mg/dL — AB (ref 6–20)
CALCIUM: 8.6 mg/dL — AB (ref 8.9–10.3)
CO2: 17 mmol/L — AB (ref 22–32)
Chloride: 115 mmol/L — ABNORMAL HIGH (ref 101–111)
Creatinine, Ser: 1.72 mg/dL — ABNORMAL HIGH (ref 0.61–1.24)
GFR calc Af Amer: 41 mL/min — ABNORMAL LOW (ref >60)
GFR, EST NON AFRICAN AMERICAN: 35 mL/min — AB (ref >60)
GLUCOSE: 204 mg/dL — AB (ref 65–99)
Potassium: 4.9 mmol/L (ref 3.5–5.1)
Sodium: 139 mmol/L (ref 135–145)

## 2017-05-20 LAB — URINE CULTURE: Culture: NO GROWTH

## 2017-05-20 LAB — MRSA PCR SCREENING: MRSA by PCR: NEGATIVE

## 2017-05-20 MED ORDER — INSULIN GLARGINE 100 UNIT/ML ~~LOC~~ SOLN
20.0000 [IU] | Freq: Every day | SUBCUTANEOUS | Status: DC
  Administered 2017-05-20: 20 [IU] via SUBCUTANEOUS
  Filled 2017-05-20 (×3): qty 0.2

## 2017-05-20 MED ORDER — INSULIN ASPART 100 UNIT/ML ~~LOC~~ SOLN
0.0000 [IU] | SUBCUTANEOUS | Status: DC
  Administered 2017-05-20: 7 [IU] via SUBCUTANEOUS
  Administered 2017-05-20: 2 [IU] via SUBCUTANEOUS

## 2017-05-20 MED ORDER — INSULIN ASPART 100 UNIT/ML ~~LOC~~ SOLN
0.0000 [IU] | Freq: Three times a day (TID) | SUBCUTANEOUS | Status: DC
  Administered 2017-05-20: 5 [IU] via SUBCUTANEOUS
  Administered 2017-05-21: 3 [IU] via SUBCUTANEOUS

## 2017-05-20 MED ORDER — LIVING WELL WITH DIABETES BOOK
Freq: Once | Status: AC
  Administered 2017-05-20: 06:00:00
  Filled 2017-05-20: qty 1

## 2017-05-20 MED ORDER — INSULIN ASPART 100 UNIT/ML ~~LOC~~ SOLN
5.0000 [IU] | Freq: Three times a day (TID) | SUBCUTANEOUS | Status: DC
  Administered 2017-05-20 – 2017-05-21 (×2): 5 [IU] via SUBCUTANEOUS

## 2017-05-20 MED ORDER — INSULIN STARTER KIT- PEN NEEDLES (ENGLISH)
1.0000 | Freq: Once | Status: AC
  Administered 2017-05-20: 1
  Filled 2017-05-20: qty 1

## 2017-05-20 MED ORDER — LIVING WELL WITH DIABETES BOOK
Freq: Once | Status: AC
  Administered 2017-05-20: 12:00:00
  Filled 2017-05-20: qty 1

## 2017-05-20 NOTE — Progress Notes (Signed)
   Subjective: patient is feeling much better today, his wife has noticed an improvement in his mental status.  He is not trying to get up out of bed so far but feels like he would be more steady on his feet.  He denies any nausea, vomiting, chest pain or headaches.    Objective:  Vital signs in last 24 hours: Vitals:   05/20/17 0023 05/20/17 0259 05/20/17 0748 05/20/17 1108  BP: 112/77 110/74 123/78 119/77  Pulse: 67 67  75  Resp: 20 16  (!) 23  Temp: 98.3 F (36.8 C) 98.4 F (36.9 C) 98.4 F (36.9 C) 98.3 F (36.8 C)  TempSrc:  Oral Oral Oral  SpO2: 97% 97%    Weight: 191 lb 2.2 oz (86.7 kg)     Height: 6' (1.829 m)      Physical Exam  Constitutional: He is oriented to person, place, and time. He appears well-developed and well-nourished.  Eyes: Right eye exhibits no discharge. Left eye exhibits no discharge. No scleral icterus.  Cardiovascular: Normal rate, regular rhythm, normal heart sounds and intact distal pulses.  Exam reveals no gallop and no friction rub.   No murmur heard. Pulmonary/Chest: Effort normal and breath sounds normal. No respiratory distress. He has no wheezes. He has no rales.  Abdominal: Soft. Bowel sounds are normal. He exhibits no distension and no mass. There is no tenderness. There is no guarding.  Neurological: He is alert and oriented to person, place, and time.    Assessment/Plan:  Principal Problem:   DM (diabetes mellitus), type 2, uncontrolled, with hyperosmolarity (HCC) Active Problems:   Hyperglycemia  HHS: 3 months ago pt was told by PCP he was borderline diabetic, arrived at ED with AMS, glucose of 800, increased serum osmolarity.    -resolved with treatment overnight -pt now on 20 units of lantus, 5 units of novolog with sliding scale moderate for correctional -A1C came back 14.8   AKI: prerenal, not sure of baseline creatinine, pt and wife have no knowledge of pt having chronic kidney disease  -Improved with IV fluid resuscitation  and glucose control    Dispo: Anticipated discharge in approximately 1 day(s).   Angelita InglesWinfrey, Carney Saxton B, MD 05/20/2017, 12:47 PM Thornell MuleBrandon Weslee Fogg MD PGY-1 Internal Medicine Pager # [phone]

## 2017-05-20 NOTE — Plan of Care (Signed)
Problem: Metabolic: Goal: Ability to maintain appropriate glucose levels will improve Outcome: Progressing D/C insulin drip last night; BG controlled with insulin subcut (short and long acting); pt and family receptive to teaching

## 2017-05-20 NOTE — Care Management Obs Status (Signed)
MEDICARE OBSERVATION STATUS NOTIFICATION   Patient Details  Name: Joseph Robertson MRN: [id number] Date of Birth: [birth year]   Medicare Observation Status Notification Given:  Yes    Kermit BaloKelli F Requan Hardge, RN 05/20/2017, 4:24 PM

## 2017-05-20 NOTE — Care Management CC44 (Signed)
Condition Code 44 Documentation Completed  Patient Details  Name: Joseph Robertson MRN: [id number] Date of Birth: January 18, 1933   Condition Code 44 given:  Yes Patient signature on Condition Code 44 notice:  Yes Documentation of 2 MD's agreement:  Yes Code 44 added to claim:  Yes    Kermit BaloKelli F Ritchie Klee, RN 05/20/2017, 4:24 PM

## 2017-05-20 NOTE — Progress Notes (Signed)
Patient arrived to the floor 

## 2017-05-20 NOTE — Progress Notes (Addendum)
Inpatient Diabetes Program Recommendations  AACE/ADA: New Consensus Statement on Inpatient Glycemic Control (2015)  Target Ranges:  Prepandial:   less than 140 mg/dL      Peak postprandial:   less than 180 mg/dL (1-2 hours)      Critically ill patients:  140 - 180 mg/dL   Lab Results  Component Value Date   GLUCAP 307 (H) 05/20/2017   HGBA1C 14.8 (H) 05/19/2017    Review of Glycemic ControlResults for Joseph Robertson, Joseph Robertson (MRN [id number]) as of 05/20/2017 15:00  Ref. Range 05/20/2017 02:58 05/20/2017 04:15 05/20/2017 05:28 05/20/2017 08:04 05/20/2017 12:02  Glucose-Capillary Latest Ref Range: 65 - 99 mg/dL 166 (H) 149 (H) 113 (H) 153 (H) 307 (H)    Diabetes history: New onset Type 2 diabetes Outpatient Diabetes medications:None   Current orders for Inpatient glycemic control:  Novolog moderate tid with meals  Inpatient Diabetes Program Recommendations:    -Please consider continuing Lantus 20 units daily.  Also consider adding Novolog 5 units tid with meals.  Note orders already placed for both by MD.   Damaris Schooner briefly with patient, wife and daughter.  This was completely unexpected to them.  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  Based on A1C patient will need insulin at discharge.  Patient states he feels he can self-administer insulin and check blood sugars.  Wife seems nervous about insulin.  Ordered Living well with diabetes booklet, insulin starter kit, Diabetes videos, and nutrition consult.  Explained that RN's will focus on survival skill education for diabetes.  Diabetes Coordinator will follow up on 10/23.   Thanks, Adah Perl, RN, BC-ADM Inpatient Diabetes Coordinator Pager [phone] (8a-5p)

## 2017-05-21 ENCOUNTER — Encounter (HOSPITAL_COMMUNITY): Payer: Self-pay | Admitting: *Deleted

## 2017-05-21 DIAGNOSIS — N179 Acute kidney failure, unspecified: Secondary | ICD-10-CM | POA: Diagnosis not present

## 2017-05-21 DIAGNOSIS — E1100 Type 2 diabetes mellitus with hyperosmolarity without nonketotic hyperglycemic-hyperosmolar coma (NKHHC): Secondary | ICD-10-CM | POA: Diagnosis not present

## 2017-05-21 LAB — BASIC METABOLIC PANEL
ANION GAP: 9 (ref 5–15)
BUN: 16 mg/dL (ref 6–20)
CO2: 25 mmol/L (ref 22–32)
Calcium: 8.9 mg/dL (ref 8.9–10.3)
Chloride: 105 mmol/L (ref 101–111)
Creatinine, Ser: 1.49 mg/dL — ABNORMAL HIGH (ref 0.61–1.24)
GFR, EST AFRICAN AMERICAN: 49 mL/min — AB (ref >60)
GFR, EST NON AFRICAN AMERICAN: 42 mL/min — AB (ref >60)
Glucose, Bld: 177 mg/dL — ABNORMAL HIGH (ref 65–99)
POTASSIUM: 3.3 mmol/L — AB (ref 3.5–5.1)
SODIUM: 139 mmol/L (ref 135–145)

## 2017-05-21 LAB — GLUCOSE, CAPILLARY
GLUCOSE-CAPILLARY: 74 mg/dL (ref 65–99)
GLUCOSE-CAPILLARY: 190 mg/dL — AB (ref 65–99)

## 2017-05-21 MED ORDER — PEN NEEDLES 31G X 5 MM MISC: 1.0000 "pen " | Freq: Every day | 2 refills | Status: AC

## 2017-05-21 MED ORDER — INSULIN GLARGINE 100 UNIT/ML ~~LOC~~ SOLN: 18.0000 [IU] | Freq: Every day | SUBCUTANEOUS | Status: DC

## 2017-05-21 MED ORDER — GLUCOSE BLOOD VI STRP: ORAL_STRIP | 12 refills | Status: AC

## 2017-05-21 MED ORDER — INSULIN GLARGINE 100 UNIT/ML ~~LOC~~ SOLN: 17.0000 [IU] | Freq: Every day | SUBCUTANEOUS | Status: DC

## 2017-05-21 MED ORDER — ONETOUCH ULTRA MINI W/DEVICE KIT: 1.0000 | PACK | Freq: Once | 0 refills | Status: AC

## 2017-05-21 MED ORDER — METFORMIN HCL ER (MOD) 500 MG PO TB24: 500.0000 mg | ORAL_TABLET | Freq: Every day | ORAL | 1 refills | Status: AC

## 2017-05-21 MED ORDER — ONETOUCH DELICA LANCETS FINE MISC: 1.0000 | Freq: Once | 2 refills | Status: AC

## 2017-05-21 MED ORDER — INSULIN GLARGINE 100 UNITS/ML SOLOSTAR PEN: 18.0000 [IU] | PEN_INJECTOR | Freq: Every day | SUBCUTANEOUS | 2 refills | Status: AC

## 2017-05-21 MED ORDER — POTASSIUM CHLORIDE CRYS ER 20 MEQ PO TBCR
40.0000 meq | EXTENDED_RELEASE_TABLET | Freq: Once | ORAL | Status: AC
  Administered 2017-05-21: 40 meq via ORAL
  Filled 2017-05-21: qty 2

## 2017-05-21 NOTE — Progress Notes (Signed)
   Subjective: patient continues to feel well, had a good night's sleep and remains at his baseline mental status.  He denies any symptoms of hypoglycemia this am.  He was able to walk back and forth to the bathroom yesterday without difficulty.  He denies any nausea, vomiting, chest pain or headaches.    Objective:  Vital signs in last 24 hours: Vitals:   05/20/17 1757 05/20/17 2037 05/21/17 0052 05/21/17 0542  BP: 110/68 100/60 100/69 123/75  Pulse: 76 70 71 73  Resp: 20 20 20 18   Temp: 98.9 F (37.2 C) 98 F (36.7 C) 98.1 F (36.7 C) 98.1 F (36.7 C)  TempSrc: Oral Oral Oral Oral  SpO2: 97% 96% 96% 95%  Weight:      Height:       Physical Exam  Constitutional: He is oriented to person, place, and time. He appears well-developed and well-nourished.  Eyes: Right eye exhibits no discharge. Left eye exhibits no discharge. No scleral icterus.  Cardiovascular: Normal rate, regular rhythm, normal heart sounds and intact distal pulses.  Exam reveals no gallop and no friction rub.   No murmur heard. Pulmonary/Chest: Effort normal and breath sounds normal. No respiratory distress. He has no wheezes. He has no rales.  Abdominal: Soft. Bowel sounds are normal. He exhibits no distension and no mass. There is no tenderness. There is no guarding.  Neurological: He is alert and oriented to person, place, and time.    Assessment/Plan:  Principal Problem:   DM (diabetes mellitus), type 2, uncontrolled, with hyperosmolarity (HCC) Active Problems:   Essential hypertension   AKI (acute kidney injury) (HCC)   Hyperlipidemia   Hyperglycemia  HHS: 3 months ago pt was told by PCP he was borderline diabetic, arrived at ED with AMS, glucose of 800, increased serum osmolarity.    -diabetes educators saw pt yesterday -went over how pt will inject his insulin and he feels comfortable doing so -resolved with treatment -CBG this am was 74 -A1C came back 14.8 -Will send home on lantus 18 units  nightly, and metformin 500mg  daily to be increased as tolerated.     AKI: prerenal, not sure of baseline creatinine, pt and wife have no knowledge of pt having chronic kidney disease  -Improved with IV fluid resuscitation and glucose control creatinine down to 1.49 today from 1.90 on admission     Dispo: Anticipated discharge home today with close PCP follow up.   Angelita InglesWinfrey, Kmarion Rawl B, MD 05/21/2017, 9:01 AM Thornell MuleBrandon Avya Flavell MD PGY-1 Internal Medicine Pager # [phone]

## 2017-05-21 NOTE — Progress Notes (Signed)
Internal Medicine Attending:   I saw and examined the patient. I reviewed the resident's note and I agree with the resident's findings and plan as documented in the resident's note. CBG much better controlled. Patient and wife asking good questions about DM management.  Otherwise no complaints.  We discussed discharge and follow up.  Agree with Dr Tonita Phoenix plans of Lantus and low dose metformin on discharge- eGFR now >45.

## 2017-05-21 NOTE — Care Management Note (Signed)
Case Management Note  Patient Details  Name: Joseph Robertson MRN: [id number] Date of Birth: [birth year]  Subjective/Objective:                    Action/Plan: Pt discharging home with self care. Pt has PCP (Dr Arbutus PedMohamed), insurance and transportation home. No further needs per CM.  Expected Discharge Date:  05/21/17               Expected Discharge Plan:  Home/Self Care  In-House Referral:     Discharge planning Services     Post Acute Care Choice:    Choice offered to:     DME Arranged:    DME Agency:     HH Arranged:    HH Agency:     Status of Service:  Completed, signed off  If discussed at [employer] Length of Stay Meetings, dates discussed:    Additional Comments:  Kermit BaloKelli F Markis Langland, RN 05/21/2017, 12:47 PM

## 2017-05-21 NOTE — Progress Notes (Signed)
Pt given discharge instructions. Belongings taken with wife. Taken down via wheelchair.

## 2017-05-21 NOTE — Discharge Summary (Signed)
Name: Joseph Robertson MRN: [id number] DOB: 1932/10/10 83 y.o. PCP: Patient, No Pcp Per  Date of Admission: 05/19/2017  3:30 PM Date of Discharge: 05/21/2017 Attending Physician: No att. providers found  Discharge Diagnosis: 1.  New diagnosis of T2DM with HHS AKI Principal Problem:   DM (diabetes mellitus), type 2, uncontrolled, with hyperosmolarity ([place]) Active Problems:   Essential hypertension   AKI (acute kidney injury) ([place])   Hyperlipidemia   Hyperglycemia   Discharge Medications: Allergies as of 05/21/2017   No Known Allergies     Medication List    TAKE these medications   allopurinol 100 MG tablet Commonly known as:  ZYLOPRIM Take 100 mg by mouth daily.   amLODipine-benazepril 10-20 MG capsule Commonly known as:  LOTREL Take 1 capsule by mouth daily.   aspirin EC 81 MG tablet Take 81 mg by mouth daily.   glucose blood test strip Use as instructed   insulin glargine 100 unit/mL Sopn Commonly known as:  LANTUS Inject 0.18 mLs (18 Units total) into the skin at bedtime.   metFORMIN 500 MG (MOD) 24 hr tablet Commonly known as:  GLUMETZA Take 1 tablet (500 mg total) by mouth daily with breakfast.   metoprolol succinate 25 MG 24 hr tablet Commonly known as:  TOPROL-XL Take 25 mg by mouth every evening.   ONE TOUCH ULTRA MINI w/Device Kit 1 Device by Does not apply route once.   ONETOUCH DELICA LANCETS FINE Misc 1 Container by Does not apply route once.   Pen Needles 31G X 5 MM Misc 1 pen by Does not apply route daily. The patient is insulin requiring, ICD 10 code E11.9. The patient tests 1 times per day.   simvastatin 20 MG tablet Commonly known as:  ZOCOR Take 20 mg by mouth daily.       Disposition and follow-up:   Joseph Robertson was [facility] from [facility] in Good condition.  At the hospital follow up visit please address:  1.   HHS with new diagnosis of T2DM  -Pt instructed to bring a log of his blood sugars  for any necessary medication adjustments -Screen pt for low blood sugars and optimize medical management as necessary -pt requested further diabetes education -A1C was 14.8 during hospitilization  AKI -repeat basic metabolic panel -ensure pt not having osmotic diuresis, polyuria, polydipsia  2.  Labs / imaging needed at time of follow-up: CBG, BMP  3.  Pending labs/ test needing follow-up: none  Follow-up Appointments: Follow-up Information    Dr. Earlie Server Follow up in 1 week(s).   Why:  Please follow up with your PCP in the next week.   Contact information: [employer] pt does not remember practice name or exact location.          Hospital Course by problem list: Principal Problem:   DM (diabetes mellitus), type 2, uncontrolled, with hyperosmolarity ([place]) Active Problems:   Essential hypertension   AKI (acute kidney injury) ([place])   Hyperlipidemia   Hyperglycemia   1.  HHS with new diagnosis of T2DM  Patient noticed that he has been having polyuria and polydipsia the last several weeks.  He's been having some difficulty walking seemed to be having some recent confusion and lethargy.  There was also a noticeable change in his voice.  He was told by his primary care doctor about 3 months ago that he was borderline diabetic. On basics labs his glucose came back at 845 with a sodium of 135  potassium 4.6 and bicarbonate 26.  His creatinine was 2.33 he had no anion gap is pH was 7.45normal white count and normal hemoglobin.  A CT head was performed which was negative for acute processes.  Patient received a presumptive diagnosis of HHS and was started on IV insulin and aggressive fluid hydration with electrolyte replacement.  The next morning patient was back to his baseline mental status he responded very well to the therapy.  We transition the patient to basal bolus regimen of Lantus and NovoLog 20 units and 5 units respectively.  The diabetes educators were consulted and they spent  some time with the patient and his wife going over dietary recommendations and showing him how to use insulin.  We decided it was best to send the patient home with Lantus 18 units daily and start the patient on metformin 500 mg daily with close PCP follow-up.     AKI Cr of 2.33 on arrival.  Patient denied history of known chronic kidney disease.  This was likely prerenal due to fluid loss from osmotic diuresis.  His creatinine continued to improve over the hospital stay and was 1.49 on the day of discharge.    Discharge Vitals:   BP 107/64 (BP Location: Right Arm)   Pulse 89   Temp 98.4 F (36.9 C) (Oral)   Resp 19   Ht 6' (1.829 m)   Wt 191 lb 2.2 oz (86.7 kg)   SpO2 97%   BMI 25.92 kg/m   Pertinent Labs, Studies, and Procedures:  BMP Latest Ref Rng & Units 05/21/2017 05/20/2017 05/20/2017  Glucose 65 - 99 mg/dL 177(H) 197(H) 204(H)  BUN 6 - 20 mg/dL 16 23(H) 27(H)  Creatinine 0.61 - 1.24 mg/dL 1.49(H) 1.64(H) 1.72(H)  Sodium 135 - 145 mmol/L 139 142 139  Potassium 3.5 - 5.1 mmol/L 3.3(L) 4.7 4.9  Chloride 101 - 111 mmol/L 105 112(H) 115(H)  CO2 22 - 32 mmol/L 25 20(L) 17(L)  Calcium 8.9 - 10.3 mg/dL 8.9 9.0 8.6(L)   CBC Latest Ref Rng & Units 05/19/2017 05/19/2017  WBC 4.0 - 10.5 K/uL - 5.4  Hemoglobin 13.0 - 17.0 g/dL 17.7(H) 17.1(H)  Hematocrit 39.0 - 52.0 % 52.0 51.1  Platelets 150 - 400 K/uL - 243    Discharge Instructions: Discharge Instructions    Call MD for:    Complete by:  As directed    Call MD for:  difficulty breathing, headache or visual disturbances    Complete by:  As directed    Call MD for:  extreme fatigue    Complete by:  As directed    Call MD for:  hives    Complete by:  As directed    Call MD for:  persistant dizziness or light-headedness    Complete by:  As directed    Call MD for:  persistant nausea and vomiting    Complete by:  As directed    Call MD for:  redness, tenderness, or signs of infection (pain, swelling, redness, odor or  green/yellow discharge around incision site)    Complete by:  As directed    Call MD for:  severe uncontrolled pain    Complete by:  As directed    Call MD for:  temperature >100.4    Complete by:  As directed    Diet - low sodium heart healthy    Complete by:  As directed    Discharge instructions    Complete by:  As directed    It  was great meeting you Mr. Carrero.  You have been diagnosed with type 2 diabetes.  Some key things for you to do after leaving the hospital: 1. Follow up closely with your PCP in the next week.  2. Check your blood sugars 2-3 times per day.  Check first thing in the morning before you eat, check around 2 hours after a meal and check before bedtime.  Write these numbers down so your doctor can adjust your medications as needed.  3. We are starting you on metformin 536m daily to be taken with your biggest meal of the day.  We also started you on Insulin (lantus) 18 units at bedtime just like you were doing in the hospital.  Take this lantus every evening at the same time.  4. Also buy some glucose tabs from the pharmacy that you can take if you feel like your blood sugar is getting low.  If you begin to feel abnormal like you did before you came in or if you feel shaky, start sweating and feel bad check your blood sugar.  5. Also it will be important to follow the diet described by the diabetes educator, this will go a long way in reducing your blood sugars.  If you notice your sugars are very high and you begin to have increased urination and increased drinking please see your doctor right away to adjust your medications.  If he is not available seek medical help elsewhere here at the hospital ED if necessary.   Increase activity slowly    Complete by:  As directed       Signed: WKatherine Roan MD 05/21/2017, 4:50 PM

## 2018-02-10 ENCOUNTER — Other Ambulatory Visit: Payer: Self-pay | Admitting: Internal Medicine

## 2019-01-26 IMAGING — CT CT HEAD W/O CM
3 series · 15 of 47 positions shown, 18 images · non-contrast
Comparison: None.

CLINICAL DATA: Neuro deficit subacute

EXAM:
CT HEAD WITHOUT CONTRAST
TECHNIQUE: Contiguous axial images were obtained from the base of the skull
through the vertex without intravenous contrast.

[Series 3: head 5.0 h30s · axial · 0.44mm/px · z∈[-64,+61]mm · 9 of 31 slices shown, 12 images]
[im 3/31  brain]
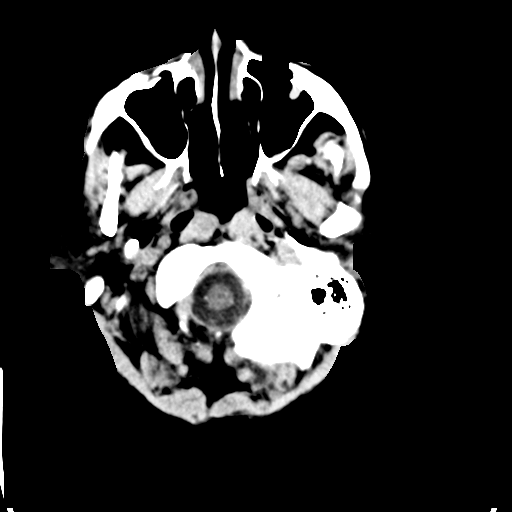
[im 3/31  bone]
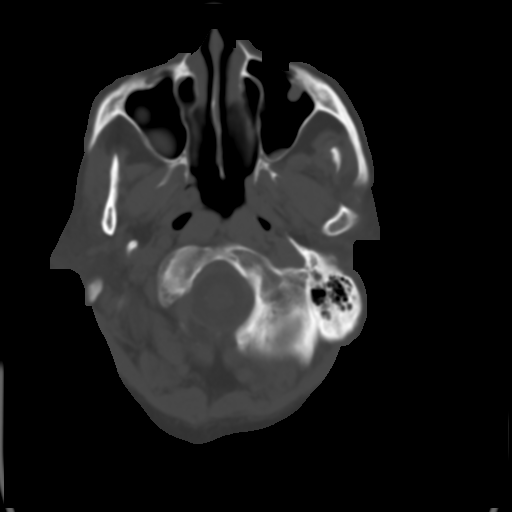
[im 6/31  brain]
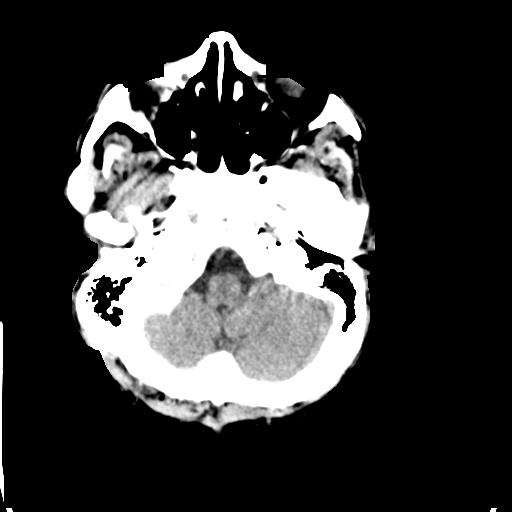
[im 9/31  brain]
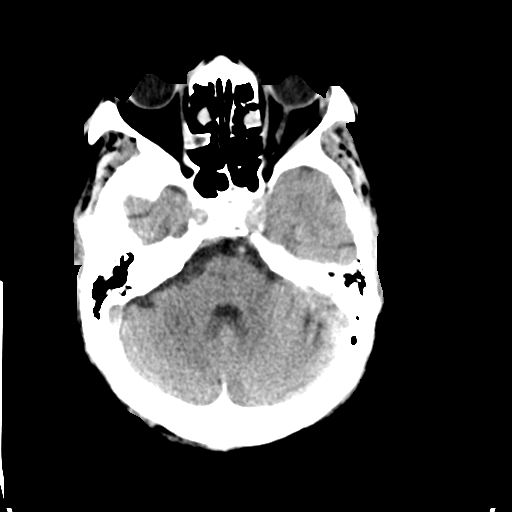
[im 12/31  brain]
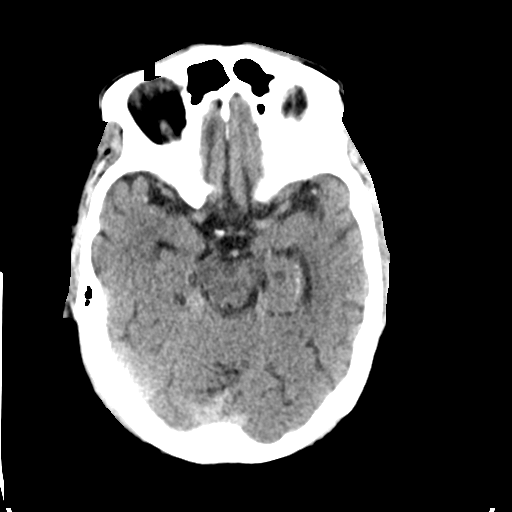
[im 16/31  brain]
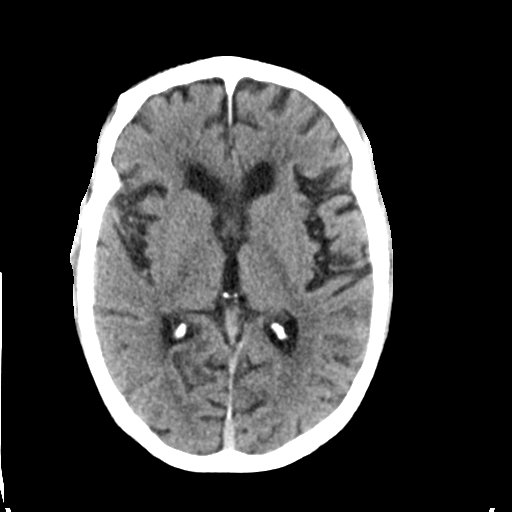
[im 16/31  bone]
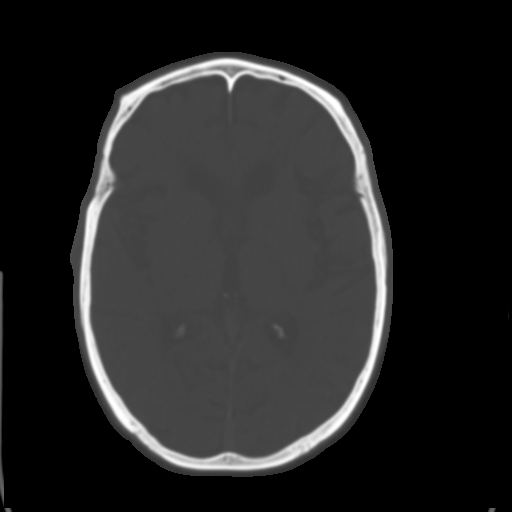
[im 19/31  brain]
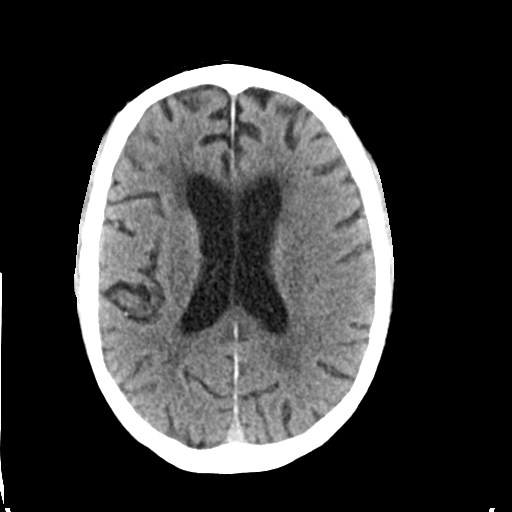
[im 22/31  brain]
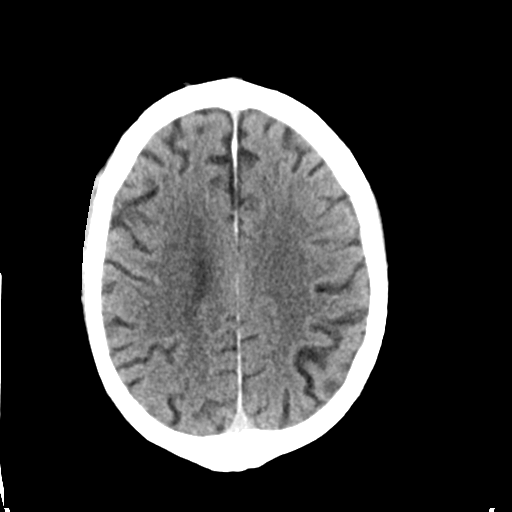
[im 25/31  brain]
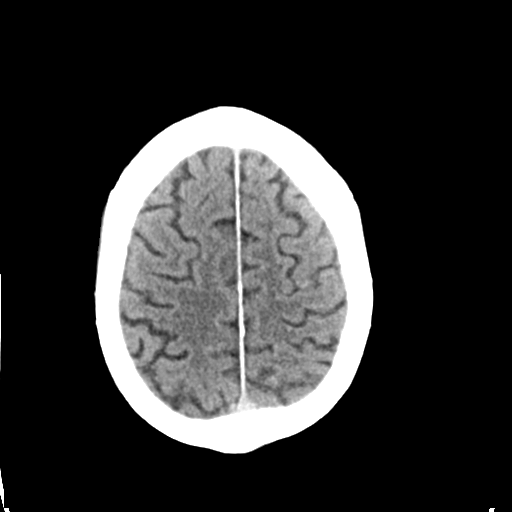
[im 28/31  brain]
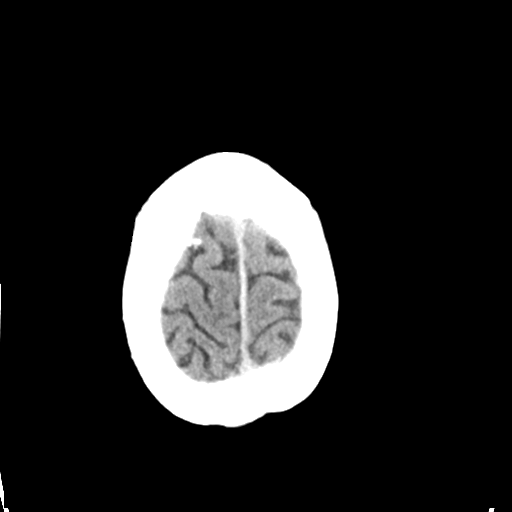
[im 28/31  bone]
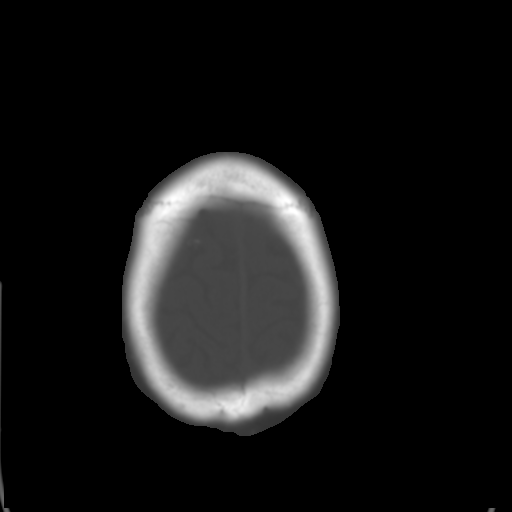

[Series 5: head 3.0 mpr cor · coronal · 0.30mm/px · 3 of 74 slices shown]
[im 25/74  brain]
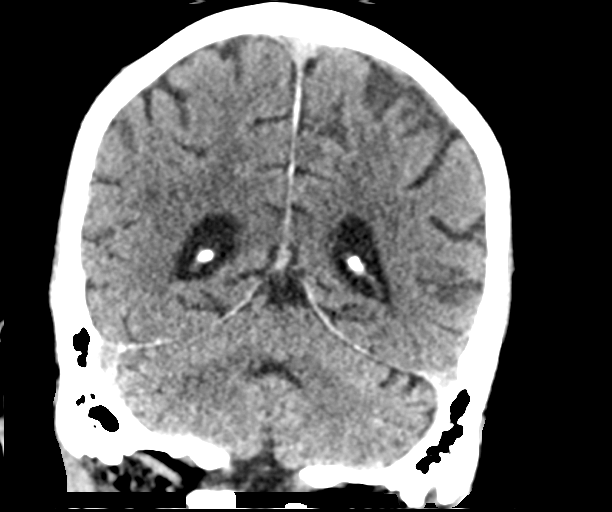
[im 33/74  brain]
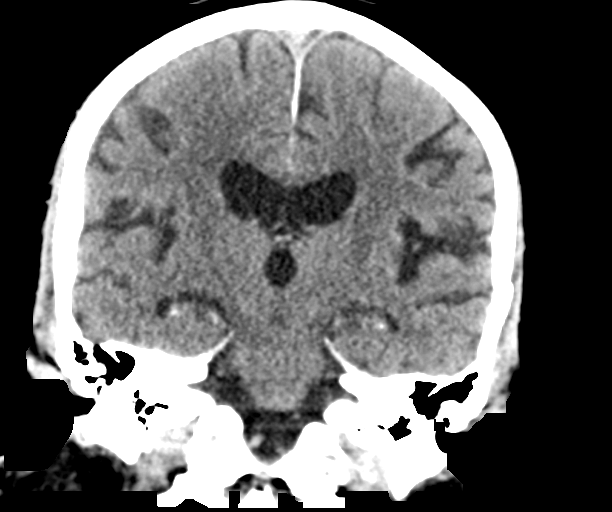
[im 41/74  brain]
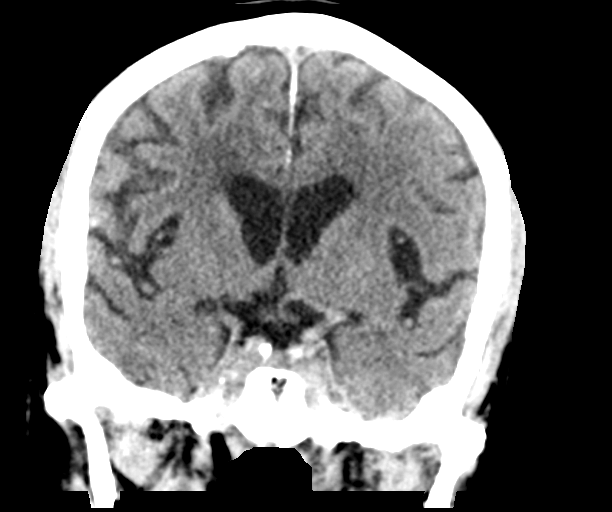

[Series 6: head 3.0 mpr sag · sagittal · 0.34mm/px · 3 of 66 slices shown]
[im 22/66  brain]
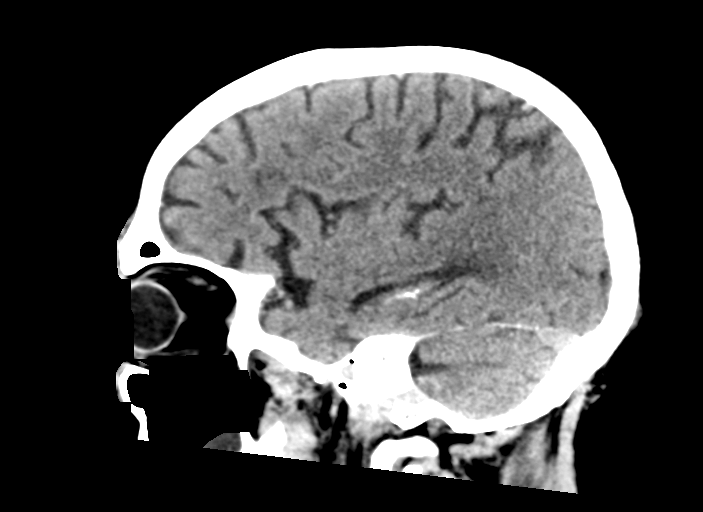
[im 33/66  brain]
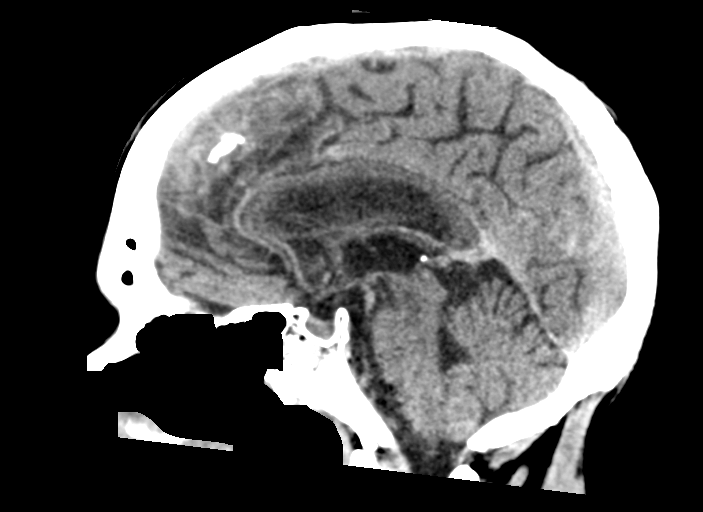
[im 44/66  brain]
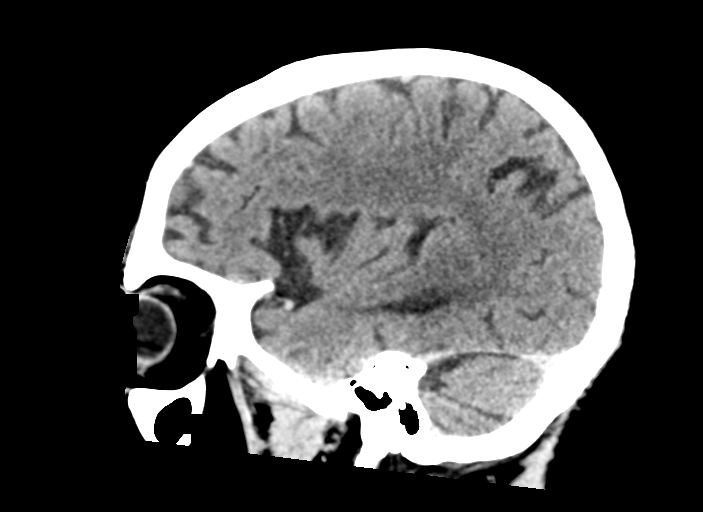

[15 of 47 positions shown; findings below may reference images not displayed]

FINDINGS: Brain: Generalized atrophy. Negative for hydrocephalus. Mild patchy
hypodensity in the cerebral white matter bilaterally appears
chronic. No acute infarct, hemorrhage, mass lesion.

Vascular: Negative for hyperdense vessel

Skull: Negative

Sinuses/Orbits: Mild mucosal edema in the paranasal sinuses. Normal
orbit.

Other: None
IMPRESSION: Atrophy with chronic microvascular ischemia.  No acute abnormality.

## 2020-04-12 ENCOUNTER — Ambulatory Visit: Payer: Medicare Other | Attending: Internal Medicine

## 2020-04-12 DIAGNOSIS — Z23 Encounter for immunization: Secondary | ICD-10-CM

## 2020-04-12 NOTE — Progress Notes (Signed)
   Covid-19 Vaccination Clinic  Name:  Joseph Robertson    MRN: [id number] DOB: 12/07/1932  04/12/2020  Mr. Joseph Robertson was observed post Covid-19 immunization for 15 minutes without incident. He was provided with Vaccine Information Sheet and instruction to access the V-Safe system.   Mr. Joseph Robertson was instructed to call 911 with any severe reactions post vaccine: Marland Kitchen Difficulty breathing  . Swelling of face and throat  . A fast heartbeat  . A bad rash all over body  . Dizziness and weakness

## 2021-01-20 ENCOUNTER — Ambulatory Visit: Payer: Medicare Other | Attending: Internal Medicine

## 2021-01-20 ENCOUNTER — Other Ambulatory Visit (HOSPITAL_BASED_OUTPATIENT_CLINIC_OR_DEPARTMENT_OTHER): Payer: Self-pay

## 2021-01-20 ENCOUNTER — Other Ambulatory Visit: Payer: Self-pay

## 2021-01-20 DIAGNOSIS — Z23 Encounter for immunization: Secondary | ICD-10-CM

## 2021-01-20 MED ORDER — PFIZER-BIONT COVID-19 VAC-TRIS 30 MCG/0.3ML IM SUSP
INTRAMUSCULAR | 0 refills | Status: AC
  Filled 2021-01-20: qty 0.3, 1d supply, fill #0

## 2021-01-20 NOTE — Progress Notes (Signed)
   Covid-19 Vaccination Clinic  Name:  Joseph Robertson    MRN: [id number] DOB: [birth year]  01/20/2021  Mr. Hattabaugh was observed post Covid-19 immunization for 15 minutes without incident. He was provided with Vaccine Information Sheet and instruction to access the V-Safe system.   Mr. Santistevan was instructed to call 911 with any severe reactions post vaccine: Difficulty breathing  Swelling of face and throat  A fast heartbeat  A bad rash all over body  Dizziness and weakness   Immunizations Administered     Name Date Dose VIS Date Route   PFIZER Comrnaty(Gray TOP) Covid-19 Vaccine 01/20/2021  2:26 PM 0.3 mL 07/07/2020 Intramuscular   Manufacturer: [employer], [employer]   Lot: [id number]   NDC: [phone]

## 2021-06-06 ENCOUNTER — Ambulatory Visit: Payer: Medicare Other

## 2021-10-01 ENCOUNTER — Other Ambulatory Visit: Payer: Self-pay

## 2021-10-01 ENCOUNTER — Emergency Department (HOSPITAL_COMMUNITY)
Admission: EM | Admit: 2021-10-01 | Discharge: 2021-10-02 | Disposition: A | Discharge status: Home / Self Care | Payer: Medicare PPO | Attending: Emergency Medicine | Admitting: Emergency Medicine

## 2021-10-01 ENCOUNTER — Encounter (HOSPITAL_COMMUNITY): Payer: Self-pay | Admitting: Emergency Medicine

## 2021-10-01 DIAGNOSIS — Z794 Long term (current) use of insulin: Secondary | ICD-10-CM | POA: Insufficient documentation

## 2021-10-01 DIAGNOSIS — U071 COVID-19: Secondary | ICD-10-CM | POA: Insufficient documentation

## 2021-10-01 DIAGNOSIS — I491 Atrial premature depolarization: Secondary | ICD-10-CM

## 2021-10-01 DIAGNOSIS — I10 Essential (primary) hypertension: Secondary | ICD-10-CM | POA: Diagnosis not present

## 2021-10-01 DIAGNOSIS — Z79899 Other long term (current) drug therapy: Secondary | ICD-10-CM | POA: Diagnosis not present

## 2021-10-01 DIAGNOSIS — R509 Fever, unspecified: Secondary | ICD-10-CM | POA: Diagnosis present

## 2021-10-01 DIAGNOSIS — Z7984 Long term (current) use of oral hypoglycemic drugs: Secondary | ICD-10-CM | POA: Diagnosis not present

## 2021-10-01 DIAGNOSIS — Z7982 Long term (current) use of aspirin: Secondary | ICD-10-CM | POA: Insufficient documentation

## 2021-10-01 DIAGNOSIS — E119 Type 2 diabetes mellitus without complications: Secondary | ICD-10-CM | POA: Diagnosis not present

## 2021-10-01 LAB — CBG MONITORING, ED: Glucose-Capillary: 118 mg/dL — ABNORMAL HIGH (ref 70–99)

## 2021-10-01 MED ORDER — ACETAMINOPHEN 325 MG PO TABS: 650.0000 mg | ORAL_TABLET | Freq: Once | ORAL | Status: DC

## 2021-10-01 NOTE — ED Triage Notes (Signed)
Family was tested and Pt had a home test of COVID.Pt's symptoms started today which include cough, sneezing, runny nose and a fever.   ?

## 2021-10-01 NOTE — ED Provider Notes (Signed)
[facility] EMERGENCY DEPARTMENT Provider Note   CSN: [id number] Arrival date & time: 10/01/21  2102     History  Chief Complaint  Patient presents with   Covid Positive    Joseph Robertson is a 88 y.o. male with history of diabetes and hypertension as well as hypercholesterolemia who presents with concern for positive COVID-19 test at home today.  Patient with sneezing, dry cough, runny nose, and fever with Tmax of 101 F.  Recently exposed to his daughter who is COVID-positive.  Patient states he called his primary care doctor and requested to be seen, however his doctor said it would not be safe for him to follow-up in the outpatient setting and directed him to the emergency department.  Is unclear exactly why this recommendation was made.  No diarrhea or vomiting.  No melena, hematochezia, or syncope.  Patient is not altered in his mental status.  He endorses compliance with his medications.  Of personally reviewed his medical records.  In addition to the above listed complications he has history of hyperlipidemia as well.  HPI     Home Medications Prior to Admission medications   Medication Sig Start Date End Date Taking? Authorizing Provider  molnupiravir EUA (LAGEVRIO) 200 mg CAPS capsule Take 4 capsules (800 mg total) by mouth 2 (two) times daily for 5 days. 10/02/21 10/07/21 Yes Alyze Lauf, Eugene Gavia, PA-C  allopurinol (ZYLOPRIM) 100 MG tablet Take 100 mg by mouth daily. 08/17/16   [provider]  amLODipine-benazepril (LOTREL) 10-20 MG capsule Take 1 capsule by mouth daily. 10/11/16   [provider]  aspirin EC 81 MG tablet Take 81 mg by mouth daily.    [provider]  COVID-19 mRNA Vac-TriS, Pfizer, (PFIZER-BIONT COVID-19 VAC-TRIS) SUSP injection Inject into the muscle. 01/20/21   Judyann Munson, MD  glucose blood test strip Use as instructed 05/21/17   Angelita Ingles, MD  insulin glargine (LANTUS) 100 unit/mL SOPN Inject 0.18  mLs (18 Units total) into the skin at bedtime. 05/21/17   Angelita Ingles, MD  metFORMIN (GLUMETZA) 500 MG (MOD) 24 hr tablet Take 1 tablet (500 mg total) by mouth daily with breakfast. 05/21/17 07/20/17  Angelita Ingles, MD  metoprolol succinate (TOPROL-XL) 25 MG 24 hr tablet Take 25 mg by mouth every evening. 10/11/16   [provider]  simvastatin (ZOCOR) 20 MG tablet Take 20 mg by mouth daily. 10/11/16   [provider]      Allergies    Patient has no known allergies.    Review of Systems   Review of Systems  Constitutional:  Positive for activity change, appetite change, chills, fatigue and fever.  HENT:  Positive for congestion and rhinorrhea. Negative for sore throat.   Eyes: Negative.   Respiratory:  Positive for cough. Negative for chest tightness and shortness of breath.   Cardiovascular: Negative.   Gastrointestinal: Negative.   Genitourinary: Negative.   Musculoskeletal: Negative.   Neurological:  Positive for headaches. Negative for dizziness and light-headedness.   Physical Exam Updated Vital Signs BP (!) 148/76 (BP Location: Right Arm)    Pulse 64    Temp 99.9 F (37.7 C) (Oral)    Resp 18    Ht 6' (1.829 m)    Wt 95.7 kg    SpO2 94%    BMI 28.62 kg/m  Physical Exam Vitals and nursing note reviewed.  Constitutional:      Appearance: He is obese. He is ill-appearing. He is  not toxic-appearing.  HENT:     Head: Normocephalic and atraumatic.     Nose: Congestion and rhinorrhea present. Rhinorrhea is clear.     Mouth/Throat:     Mouth: Mucous membranes are moist.     Pharynx: Oropharynx is clear. Uvula midline. No oropharyngeal exudate or posterior oropharyngeal erythema.     Tonsils: No tonsillar exudate.  Eyes:     General: Lids are normal. Vision grossly intact.        Right eye: No discharge.        Left eye: No discharge.     Extraocular Movements: Extraocular movements intact.     Conjunctiva/sclera: Conjunctivae normal.     Pupils:  Pupils are equal, round, and reactive to light.  Neck:     Trachea: Trachea and phonation normal.  Cardiovascular:     Rate and Rhythm: Bradycardia present. Rhythm regularly irregular.     Pulses: Normal pulses.     Heart sounds: Normal heart sounds. No murmur heard. No systolic murmur is present.     Comments: Bradycardic with heart rate in the 60s at time of my evaluation with 1 to 2-second pause every second beat, per patient no history of similar presentation.  Follows Dr. Sharyn Lull as PCP. Pulmonary:     Effort: Pulmonary effort is normal. No respiratory distress.     Breath sounds: Normal breath sounds. No wheezing or rales.  Abdominal:     General: Bowel sounds are normal. There is no distension.     Palpations: Abdomen is soft.     Tenderness: There is no abdominal tenderness. There is no right CVA tenderness, left CVA tenderness, guarding or rebound.  Musculoskeletal:        General: No deformity.     Cervical back: Normal range of motion and neck supple. No tenderness.     Right lower leg: No edema.     Left lower leg: No edema.  Lymphadenopathy:     Cervical: No cervical adenopathy.  Skin:    General: Skin is warm and dry.     Capillary Refill: Capillary refill takes less than 2 seconds.  Neurological:     General: No focal deficit present.     Mental Status: He is alert and oriented to person, place, and time. Mental status is at baseline.  Psychiatric:        Mood and Affect: Mood normal.    ED Results / Procedures / Treatments   Labs (all labs ordered are listed, but only abnormal results are displayed) Labs Reviewed  CBG MONITORING, ED - Abnormal; Notable for the following components:      Result Value   Glucose-Capillary 118 (*)    All other components within normal limits  BASIC METABOLIC PANEL  CBC WITH DIFFERENTIAL/PLATELET  MAGNESIUM    EKG None  Radiology No results found.  Procedures Procedures    Medications Ordered in ED Medications   acetaminophen (TYLENOL) tablet 650 mg (650 mg Oral Patient Refused/Not Given 10/01/21 2359)    ED Course/ Medical Decision Making/ A&P Clinical Course as of 10/02/21 0046  Mon Oct 02, 2021  0044 Consult to cardiologist, Dr. Lendell Caprice, who favors blocked PACs as etiology for dysrhythmia noted on EKG.  As patient is asymptomatic and has normal chronotropic response was heart rate increases with ambulation no indication for admission to the hospital this time.  Patient does have close outpatient follow-up with Dr. Sharyn Lull.  I appreciate his collaboration in the care of this patient. [RS]  Clinical Course User Index [RS] Kiree Dejarnette, Eugene Gavia, PA-C                           Medical Decision Making 88 year old male with history of known COVID-19 infection diagnosed today in the outpatient setting who presents with concern for COVID-19 symptoms.  Febrile in intake , heart rate in the 90s.  Vital signs otherwise normal.  Cardiac exam with abnormal bradycardic rhythm as above.  Pulmonary exam unremarkable.  Patient with clear rhinorrhea and nasal congestion but otherwise benign HEENT exam.  Neurovascular intact in all 4 extremities.  Amount and/or Complexity of Data Reviewed Labs: ordered.    Details: CBG normal at 118.  CBC, BMP, and magnesium are pending at this time. ECG/medicine tests:     Details: EKG with bradycardia with questionable heart block versus PAC. Discussion of management or test interpretation with external provider(s): Consult to cardiology as above, favor blocked PACs as etiology for EKG changes.  Per cardiology no indication for admission at this time given normal chronotropic response and patient was asymptomatic.  Risk OTC drugs.   Patient waiting laboratory studies at time of shift change.  Care of this patient signed out to oncoming ED provider Dr. Posey Rea.  All pertinent HPI, physical exam, EKG findings were discussed with him prior to my partner.  Pending normal  laboratory studies patient may be discharged home.  Antiviral therapy for COVID-19 already prescribed to patient's pharmacy.  Mr. Samples voiced understanding of his medical evaluation and treatment plan.  Each of his questions answered to his expressed satisfaction.  This chart was dictated using voice recognition software, Dragon. Despite the best efforts of this provider to proofread and correct errors, errors may still occur which can change documentation meaning. Final Clinical Impression(s) / ED Diagnoses Final diagnoses:  None    Rx / DC Orders ED Discharge Orders          Ordered    molnupiravir EUA (LAGEVRIO) 200 mg CAPS capsule  2 times daily        10/02/21 0034              Ashish Rossetti, Eugene Gavia, PA-C 10/02/21 0046    Glendora Score, MD 10/02/21 [phone]

## 2021-10-02 LAB — CBC WITH DIFFERENTIAL/PLATELET
Abs Immature Granulocytes: 0.01 10*3/uL (ref 0.00–0.07)
Basophils Absolute: 0 10*3/uL (ref 0.0–0.1)
Basophils Relative: 1 %
Eosinophils Absolute: 0.1 10*3/uL (ref 0.0–0.5)
Eosinophils Relative: 1 %
HCT: 41.7 % (ref 39.0–52.0)
Hemoglobin: 13.9 g/dL (ref 13.0–17.0)
Immature Granulocytes: 0 %
Lymphocytes Relative: 18 %
Lymphs Abs: 0.9 10*3/uL (ref 0.7–4.0)
MCH: 29 pg (ref 26.0–34.0)
MCHC: 33.3 g/dL (ref 30.0–36.0)
MCV: 86.9 fL (ref 80.0–100.0)
Monocytes Absolute: 1.2 10*3/uL — ABNORMAL HIGH (ref 0.1–1.0)
Monocytes Relative: 25 %
Neutro Abs: 2.6 10*3/uL (ref 1.7–7.7)
Neutrophils Relative %: 55 %
Platelets: 176 10*3/uL (ref 150–400)
RBC: 4.8 MIL/uL (ref 4.22–5.81)
RDW: 12.7 % (ref 11.5–15.5)
WBC: 4.9 10*3/uL (ref 4.0–10.5)
nRBC: 0 % (ref 0.0–0.2)

## 2021-10-02 LAB — BASIC METABOLIC PANEL
Anion gap: 9 (ref 5–15)
BUN: 20 mg/dL (ref 8–23)
CO2: 23 mmol/L (ref 22–32)
Calcium: 8.7 mg/dL — ABNORMAL LOW (ref 8.9–10.3)
Chloride: 105 mmol/L (ref 98–111)
Creatinine, Ser: 1.91 mg/dL — ABNORMAL HIGH (ref 0.61–1.24)
GFR, Estimated: 34 mL/min — ABNORMAL LOW (ref >60)
Glucose, Bld: 127 mg/dL — ABNORMAL HIGH (ref 70–99)
Potassium: 3.7 mmol/L (ref 3.5–5.1)
Sodium: 137 mmol/L (ref 135–145)

## 2021-10-02 LAB — MAGNESIUM: Magnesium: 1.9 mg/dL (ref 1.7–2.4)

## 2021-10-02 MED ORDER — BENZONATATE 100 MG PO CAPS
200.0000 mg | ORAL_CAPSULE | Freq: Once | ORAL | Status: AC
  Administered 2021-10-02: 200 mg via ORAL
  Filled 2021-10-02: qty 2

## 2021-10-02 MED ORDER — MOLNUPIRAVIR EUA 200MG CAPSULE: 4.0000 | ORAL_CAPSULE | Freq: Two times a day (BID) | ORAL | 0 refills | Status: AC

## 2021-10-02 MED ORDER — MOLNUPIRAVIR EUA 200MG CAPSULE: 4.0000 | ORAL_CAPSULE | Freq: Two times a day (BID) | ORAL | 0 refills | Status: DC

## 2021-10-02 MED ORDER — BENZONATATE 100 MG PO CAPS: 100.0000 mg | ORAL_CAPSULE | Freq: Three times a day (TID) | ORAL | 0 refills | Status: AC

## 2021-10-02 MED ORDER — BENZONATATE 100 MG PO CAPS: 100.0000 mg | ORAL_CAPSULE | Freq: Three times a day (TID) | ORAL | 0 refills | Status: DC

## 2021-10-02 NOTE — ED Provider Notes (Signed)
Patient received in handoff.  Sent in for COVID-19 with a cough.  Patient not a candidate for paxlovid.  His EKG shows sinus rhythm with blocked PACs.  Spoke with cardiology and as the patient is asymptomatic he is safe for outpatient follow-up.  Labs pending at time of signout and on reevaluation his labs show a creatinine of 1.9 which is likely near this patient's baseline.  He is requesting something for cough and thus he was discharged on Tessalon Perles.  Message sent to his primary care physician/cardiologist for closer follow-up. ?  Glendora Score, MD ?10/02/21 [phone] ? ?

## 2021-10-02 NOTE — ED Notes (Signed)
Pt discharged and ambulated out of the ED without difficulty. 

## 2023-04-24 ENCOUNTER — Ambulatory Visit: Payer: Medicare PPO | Admitting: Podiatry

## 2023-04-24 ENCOUNTER — Encounter: Payer: Self-pay | Admitting: Podiatry

## 2023-04-24 DIAGNOSIS — E119 Type 2 diabetes mellitus without complications: Secondary | ICD-10-CM | POA: Diagnosis not present

## 2023-04-24 DIAGNOSIS — B351 Tinea unguium: Secondary | ICD-10-CM | POA: Insufficient documentation

## 2023-04-24 DIAGNOSIS — M79674 Pain in right toe(s): Secondary | ICD-10-CM | POA: Diagnosis not present

## 2023-04-24 DIAGNOSIS — M79675 Pain in left toe(s): Secondary | ICD-10-CM | POA: Diagnosis not present

## 2023-04-24 NOTE — Progress Notes (Signed)
This patient presents to the office with chief complaint of long thick nails and diabetic feet.  This patient  says there  is  no pain and discomfort in their feet.  This patient says there are long thick painful nails.  These nails are painful walking and wearing shoes.  Patient has no history of infection or drainage from both feet.  Patient is unable to  self treat his own nails . This patient presents  to the office today for treatment of the  long nails and a foot evaluation due to history of  diabetes.  General Appearance  Alert, conversant and in no acute stress.  Vascular  Dorsalis pedis and posterior tibial  pulses are palpable  bilaterally.  Capillary return is within normal limits  bilaterally. Temperature is within normal limits  bilaterally.  Neurologic  Senn-Weinstein monofilament wire test within normal limits  bilaterally. Muscle power within normal limits bilaterally.  Nails Thick disfigured discolored nails with subungual debris  from hallux to fifth toes bilaterally. No evidence of bacterial infection or drainage bilaterally.  Orthopedic  No limitations of motion of motion feet .  No crepitus or effusions noted.  No bony pathology or digital deformities noted.  Skin  normotropic skin with no porokeratosis noted bilaterally.  No signs of infections or ulcers noted.     Onychomycosis  Diabetes with no foot complications  IE  Debride nails x 10.  A diabetic foot exam was performed and there is no evidence of any vascular or neurologic pathology.   RTC 3 months.   Helane Gunther DPM

## 2023-11-04 ENCOUNTER — Encounter: Payer: Self-pay | Admitting: Podiatry

## 2023-11-04 ENCOUNTER — Ambulatory Visit: Admitting: Podiatry

## 2023-11-04 DIAGNOSIS — M79674 Pain in right toe(s): Secondary | ICD-10-CM | POA: Diagnosis not present

## 2023-11-04 DIAGNOSIS — B351 Tinea unguium: Secondary | ICD-10-CM | POA: Diagnosis not present

## 2023-11-04 DIAGNOSIS — E119 Type 2 diabetes mellitus without complications: Secondary | ICD-10-CM | POA: Diagnosis not present

## 2023-11-04 DIAGNOSIS — M79675 Pain in left toe(s): Secondary | ICD-10-CM

## 2023-11-04 NOTE — Progress Notes (Signed)
 This patient returns to my office for at risk foot care.  This patient requires this care by a professional since this patient will be at risk due to having diabetes.  This patient is unable to cut nails himself since the patient cannot reach his nails.These nails are painful walking and wearing shoes.  This patient presents for at risk foot care today.  General Appearance  Alert, conversant and in no acute stress.  Vascular  Dorsalis pedis and posterior tibial  pulses are weakly  palpable  bilaterally.  Capillary return is within normal limits  bilaterally. Temperature is within normal limits  bilaterally.  Neurologic  Senn-Weinstein monofilament wire test within normal limits  bilaterally. Muscle power within normal limits bilaterally.  Nails Thick disfigured discolored nails with subungual debris  from hallux to fifth toes bilaterally. No evidence of bacterial infection or drainage bilaterally.  Orthopedic  No limitations of motion  feet .  No crepitus or effusions noted.  No bony pathology or digital deformities noted.  Skin  normotropic skin with no porokeratosis noted bilaterally.  No signs of infections or ulcers noted.     Onychomycosis  Pain in right toes  Pain in left toes  Consent was obtained for treatment procedures.   Mechanical debridement of nails 1-5  bilaterally performed with a nail nipper.  Filed with dremel without incident.    Return office visit     3 months                 Told patient to return for periodic foot care and evaluation due to potential at risk complications.   Helane Gunther DPM

## 2024-05-27 ENCOUNTER — Ambulatory Visit: Admitting: Podiatry

## 2024-05-27 ENCOUNTER — Encounter: Payer: Self-pay | Admitting: Podiatry

## 2024-05-27 DIAGNOSIS — B351 Tinea unguium: Secondary | ICD-10-CM | POA: Diagnosis not present

## 2024-05-27 DIAGNOSIS — M79674 Pain in right toe(s): Secondary | ICD-10-CM | POA: Diagnosis not present

## 2024-05-27 DIAGNOSIS — E119 Type 2 diabetes mellitus without complications: Secondary | ICD-10-CM

## 2024-05-27 DIAGNOSIS — M79675 Pain in left toe(s): Secondary | ICD-10-CM | POA: Diagnosis not present

## 2024-05-27 NOTE — Progress Notes (Signed)
 This patient returns to my office for at risk foot care.  This patient requires this care by a professional since this patient will be at risk due to having diabetes.  This patient is unable to cut nails himself since the patient cannot reach his nails.These nails are painful walking and wearing shoes.  This patient presents for at risk foot care today.  General Appearance  Alert, conversant and in no acute stress.  Vascular  Dorsalis pedis and posterior tibial  pulses are weakly  palpable  bilaterally.  Capillary return is within normal limits  bilaterally. Temperature is within normal limits  bilaterally.  Neurologic  Senn-Weinstein monofilament wire test within normal limits  bilaterally. Muscle power within normal limits bilaterally.  Nails Thick disfigured discolored nails with subungual debris  from hallux to fifth toes bilaterally. No evidence of bacterial infection or drainage bilaterally.  Orthopedic  No limitations of motion  feet .  No crepitus or effusions noted.  No bony pathology or digital deformities noted.  Skin  normotropic skin with no porokeratosis noted bilaterally.  No signs of infections or ulcers noted.     Onychomycosis  Pain in right toes  Pain in left toes  Consent was obtained for treatment procedures.   Mechanical debridement of nails 1-5  bilaterally performed with a nail nipper.  Filed with dremel without incident.    Return office visit     3 months                 Told patient to return for periodic foot care and evaluation due to potential at risk complications.   Helane Gunther DPM

## 2024-08-26 ENCOUNTER — Ambulatory Visit: Admitting: Podiatry
